# Patient Record
Sex: Male | Born: 1965 | Race: White | Hispanic: No | Marital: Single | Smoking: Current every day smoker
Health system: Southern US, Community
[De-identification: ages and names within clinical notes are randomized; demographics above are authoritative.]

## PROBLEM LIST (undated history)

## (undated) DIAGNOSIS — E041 Nontoxic single thyroid nodule: Secondary | ICD-10-CM

## (undated) DIAGNOSIS — E079 Disorder of thyroid, unspecified: Secondary | ICD-10-CM

## (undated) DIAGNOSIS — I7789 Other specified disorders of arteries and arterioles: Secondary | ICD-10-CM

## (undated) DIAGNOSIS — E039 Hypothyroidism, unspecified: Secondary | ICD-10-CM

## (undated) DIAGNOSIS — E890 Postprocedural hypothyroidism: Secondary | ICD-10-CM

## (undated) DIAGNOSIS — C73 Malignant neoplasm of thyroid gland: Secondary | ICD-10-CM

## (undated) HISTORY — DX: Disorder of thyroid, unspecified: E07.9

## (undated) HISTORY — PX: HERNIA REPAIR: SHX51

---
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
## 2008-07-01 ENCOUNTER — Emergency Department (HOSPITAL_COMMUNITY): Admission: EM | Admit: 2008-07-01 | Discharge: 2008-07-01 | Payer: Self-pay | Admitting: Family Medicine

## 2011-04-18 ENCOUNTER — Encounter: Payer: Self-pay | Admitting: Vascular Surgery

## 2011-04-19 ENCOUNTER — Encounter: Payer: Self-pay | Admitting: Vascular Surgery

## 2011-04-19 ENCOUNTER — Other Ambulatory Visit: Payer: Self-pay

## 2015-03-26 ENCOUNTER — Ambulatory Visit (INDEPENDENT_AMBULATORY_CARE_PROVIDER_SITE_OTHER): Payer: Self-pay | Admitting: Endocrinology

## 2015-03-26 ENCOUNTER — Encounter: Payer: Self-pay | Admitting: Endocrinology

## 2015-03-26 VITALS — BP 126/84 | HR 86 | Temp 98.2°F | Resp 14 | Ht 71.0 in | Wt 151.8 lb

## 2015-03-26 DIAGNOSIS — E041 Nontoxic single thyroid nodule: Secondary | ICD-10-CM

## 2015-03-26 HISTORY — DX: Nontoxic single thyroid nodule: E04.1

## 2015-03-26 LAB — TSH: TSH: 0.27 u[IU]/mL — ABNORMAL LOW (ref 0.35–4.50)

## 2015-03-26 NOTE — Progress Notes (Signed)
Patient ID: Marc Becker, male   DOB: [birth year], 49 y.o.   MRN: [id number]           Reason for Appointment: Thyroid nodule, new consultation    History of Present Illness:   The patient's thyroid enlargement was first discovered in 5/16 when he was having a routine physical exam He subsequently had CT scan and ultrasound exam done Ultrasound examination 7/16 showed the following: Heterogenous right thyroid lobe nodule measuring 6 cm with calcifications.  Also he had other nodules bilaterally with the largest one on the left 1.4 cm in size and solid Needle aspiration biopsy done on 12/10/14 showed cells suspicious for burglary cancer, Bethesda category 5  He has noticed hoarseness of voice since about May this year He has not  had difficulty with swallowing or any choking sensation  Scheduled for surgery in October but because of financial difficulties he did not do it and is here for another opinion  Not clear if he has had any thyroid levels done, no records available  No results found for: FREET4, TSH      Medication List    Notice  As of 03/26/2015 12:58 PM   You have not been prescribed any medications.      Allergies: No Known Allergies  Past Medical History  Diagnosis Date  . Thyroid disease     History reviewed. No pertinent past surgical history.  Family History  Problem Relation Age of Onset  . Thyroid disease Mother   . Thyroid disease Sister   . Thyroid disease Maternal Grandmother    No history of cancer in the family  Social History:  reports that he has been smoking.  He has never used smokeless tobacco. He reports that he does not drink alcohol or use illicit drugs.   Review of Systems:  Review of Systems  Constitutional: Negative for malaise/fatigue.  Respiratory: Negative for cough.   Musculoskeletal: Negative for joint pain.            Examination:   BP 126/84 mmHg  Pulse 86  Temp(Src) 98.2 F (36.8 C)  Resp 14  Ht 5\' 11"   (1.803 m)  Wt 151 lb 12.8 oz (68.856 kg)  BMI 21.18 kg/m2  SpO2 96%   General Appearance: pleasant, averagely built and nourished         Eyes: No abnormal prominence or eyelid swelling.          Neck: The thyroid is enlarged bilaterally, more on the right side.  He has about a 4.5 cm smooth slightly firm nodule on the right and left side is nodular about twice normal in size. There is no stridor. Pemberton sign is negative There is no lymphadenopathy .    Cardiovascular: Normal  heart sounds, no murmur Respiratory:  Lungs clear Abdomen shows no hepatosplenomegaly Neurological: REFLEXES: at biceps are normal.  Skin: no rash        Assessment/Plan:  Multinodular goiter with dominant right-sided nodule, 6 cm on initial evaluation and showing cytology suspicious for papillary carcinoma. He also has hoarseness of his voice making it more likely that he has malignant lesion  Have recommended that he have surgery, most likely total thyroidectomy and will refer him for this. He will see me back in follow-up after surgery for further evaluation and follow-up  TSH to be checked today is baseline   [facility] 03/26/2015

## 2015-04-12 ENCOUNTER — Encounter: Payer: Self-pay | Admitting: Endocrinology

## 2015-04-22 ENCOUNTER — Telehealth: Payer: Self-pay | Admitting: Endocrinology

## 2015-04-22 NOTE — Telephone Encounter (Signed)
[facility] called to notify Dr. Dwyane Dee that Mr. Marc Becker missed his scheduled appointment on 12.6.16    Please advise    Thank you

## 2015-04-22 NOTE — Telephone Encounter (Signed)
Please see below.

## 2015-04-22 NOTE — Telephone Encounter (Signed)
Will discuss on his next visit

## 2015-05-14 ENCOUNTER — Encounter: Payer: Self-pay | Admitting: Endocrinology

## 2015-05-14 NOTE — Progress Notes (Deleted)
Patient ID: Marc Becker, male   DOB: 03-30-1966, 49 y.o.   MRN: [id number]           Reason for Appointment: Thyroid nodule, new consultation    History of Present Illness:   The patient's thyroid enlargement was first discovered in 5/16 when he was having a routine physical exam He subsequently had CT scan and ultrasound exam done Ultrasound examination 7/16 showed the following: Heterogenous right thyroid lobe nodule measuring 6 cm with calcifications.  Also he had other nodules bilaterally with the largest one on the left 1.4 cm in size and solid Needle aspiration biopsy done on 12/10/14 showed cells suspicious for burglary cancer, Bethesda category 5  He has noticed hoarseness of voice since about May this year He has not  had difficulty with swallowing or any choking sensation  Scheduled for surgery in October but because of financial difficulties he did not do it and is here for another opinion  Not clear if he has had any thyroid levels done, no records available  Lab Results  Component Value Date   TSH 0.27* 03/26/2015        Medication List    Notice  As of 05/14/2015  9:46 AM   You have not been prescribed any medications.      Allergies: No Known Allergies  Past Medical History  Diagnosis Date  . Thyroid disease     No past surgical history on file.  Family History  Problem Relation Age of Onset  . Thyroid disease Mother   . Thyroid disease Sister   . Thyroid disease Maternal Grandmother    No history of cancer in the family  Social History:  reports that he has been smoking.  He has never used smokeless tobacco. He reports that he does not drink alcohol or use illicit drugs.   Review of Systems:  Review of Systems  Constitutional: Negative for malaise/fatigue.  Respiratory: Negative for cough.   Musculoskeletal: Negative for joint pain.            Examination:   BP 116/82 mmHg  Pulse 73  Temp(Src) 97.9 F (36.6 C)  Resp 14  Ht 5'  11" (1.803 m)  Wt 152 lb 6.4 oz (69.128 kg)  BMI 21.26 kg/m2  SpO2 98%   General Appearance: pleasant, averagely built and nourished         Eyes: No abnormal prominence or eyelid swelling.          Neck: The thyroid is enlarged bilaterally, more on the right side.  He has about a 4.5 cm smooth slightly firm nodule on the right and left side is nodular about twice normal in size. There is no stridor. Pemberton sign is negative There is no lymphadenopathy .    Cardiovascular: Normal  heart sounds, no murmur Respiratory:  Lungs clear Abdomen shows no hepatosplenomegaly Neurological: REFLEXES: at biceps are normal.  Skin: no rash        Assessment/Plan:  Multinodular goiter with dominant right-sided nodule, 6 cm on initial evaluation and showing cytology suspicious for papillary carcinoma. He also has hoarseness of his voice making it more likely that he has malignant lesion  Have recommended that he have surgery, most likely total thyroidectomy and will refer him for this. He will see me back in follow-up after surgery for further evaluation and follow-up  TSH to be checked today is baseline   [facility] 05/14/2015

## 2015-05-17 NOTE — Progress Notes (Signed)
This encounter was created in error - please disregard.

## 2015-06-02 ENCOUNTER — Ambulatory Visit: Payer: Self-pay | Admitting: Surgery

## 2015-06-15 ENCOUNTER — Inpatient Hospital Stay (HOSPITAL_COMMUNITY)
Admission: RE | Admit: 2015-06-15 | Discharge: 2015-06-15 | Disposition: A | Discharge status: Home / Self Care | Payer: Self-pay | Source: Ambulatory Visit

## 2015-06-16 ENCOUNTER — Encounter (HOSPITAL_COMMUNITY): Payer: Self-pay

## 2015-06-16 ENCOUNTER — Encounter (HOSPITAL_COMMUNITY): Payer: Self-pay | Admitting: Surgery

## 2015-06-16 ENCOUNTER — Encounter (HOSPITAL_COMMUNITY)
Admission: RE | Admit: 2015-06-16 | Discharge: 2015-06-16 | Disposition: A | Discharge status: Home / Self Care | Payer: Self-pay | Source: Ambulatory Visit | Attending: Surgery | Admitting: Surgery

## 2015-06-16 DIAGNOSIS — F17200 Nicotine dependence, unspecified, uncomplicated: Secondary | ICD-10-CM | POA: Insufficient documentation

## 2015-06-16 DIAGNOSIS — Z01812 Encounter for preprocedural laboratory examination: Secondary | ICD-10-CM | POA: Insufficient documentation

## 2015-06-16 DIAGNOSIS — C73 Malignant neoplasm of thyroid gland: Secondary | ICD-10-CM

## 2015-06-16 DIAGNOSIS — Z01818 Encounter for other preprocedural examination: Secondary | ICD-10-CM | POA: Insufficient documentation

## 2015-06-16 HISTORY — DX: Other specified disorders of arteries and arterioles: I77.89

## 2015-06-16 HISTORY — DX: Malignant neoplasm of thyroid gland: C73

## 2015-06-16 LAB — CBC
HCT: 43.8 % (ref 39.0–52.0)
HEMOGLOBIN: 14.8 g/dL (ref 13.0–17.0)
MCH: 29.5 pg (ref 26.0–34.0)
MCHC: 33.8 g/dL (ref 30.0–36.0)
MCV: 87.3 fL (ref 78.0–100.0)
Platelets: 290 10*3/uL (ref 150–400)
RBC: 5.02 MIL/uL (ref 4.22–5.81)
RDW: 13.4 % (ref 11.5–15.5)
WBC: 8 10*3/uL (ref 4.0–10.5)

## 2015-06-16 LAB — BASIC METABOLIC PANEL
Anion gap: 8 (ref 5–15)
BUN: 11 mg/dL (ref 6–20)
CHLORIDE: 105 mmol/L (ref 101–111)
CO2: 28 mmol/L (ref 22–32)
Calcium: 9.4 mg/dL (ref 8.9–10.3)
Creatinine, Ser: 0.94 mg/dL (ref 0.61–1.24)
GFR calc Af Amer: >60 mL/min (ref >60)
GFR calc non Af Amer: >60 mL/min (ref >60)
Glucose, Bld: 106 mg/dL — ABNORMAL HIGH (ref 65–99)
POTASSIUM: 4.4 mmol/L (ref 3.5–5.1)
SODIUM: 141 mmol/L (ref 135–145)

## 2015-06-16 MED ORDER — CEFAZOLIN SODIUM-DEXTROSE 2-3 GM-% IV SOLR
2.0000 g | INTRAVENOUS | Status: AC
  Administered 2015-06-17: 2 g via INTRAVENOUS
  Filled 2015-06-16: qty 50

## 2015-06-16 NOTE — Progress Notes (Signed)
Spoke with Marc Becker re; CXR showing nodule left lung + dev trachea.  Also called CxR to Dr. Gala Lewandowsky office and spoke to Oriental who stated she would give message to Dr. Gala Lewandowsky assistant.

## 2015-06-16 NOTE — H&P (Signed)
General Surgery [facility]  Marc Becker DOB: 03/18/1966 Single / Language: [name] / Race: White Male  History of Present Illness  The patient is a 50 year old male who presents with thyroid cancer.  Patient is referred by Dr. Elayne Snare for evaluation of newly diagnosed papillary thyroid carcinoma. Patient originally was diagnosed with a thyroid nodule in May 2016 by his physician in [facility]. Patient apparently underwent CT scan of the neck and chest as well as an ultrasound of the neck. This demonstrated a 6 cm heterogeneous right thyroid mass with calcifications. There were other nodules bilaterally with the largest nodule on the left measuring 1.4 cm. Patient underwent fine-needle aspiration biopsy on December 10, 2014 at [facility]. Cytopathology was read as suspicious for papillary thyroid carcinoma, Bethesda category V. Patient has also noted hoarseness since the spring of 2016. This has been persistent. Patient is a smoker. He has not had the hoarseness evaluated. He denies any other compressive symptoms such as dysphagia or dyspnea. Patient has had no prior head or neck surgery. He has never been on thyroid medication. There is a family history of thyroid disease in his mother, his sister, and his maternal grandmother. None of these individuals were diagnosed with thyroid cancer. There is no family history of other endocrine neoplasms.  Other Problems Hemorrhoids  Past Surgical History Laparoscopic Inguinal Hernia Surgery Left.  Allergies No Known Drug Allergies01/18/2017  Medication History No Current Medications Medications Reconciled  Social History Alcohol use Remotely quit alcohol use. Caffeine use Tea. Illicit drug use Remotely quit drug use. Tobacco use Current every day smoker.  Family History Thyroid problems Mother.  Review of Systems Cardiovascular Not Present- Chest Pain, Difficulty  Breathing Lying Down, Leg Cramps, Palpitations, Rapid Heart Rate, Shortness of Breath and Swelling of Extremities. Gastrointestinal Not Present- Abdominal Pain, Bloating, Bloody Stool, Change in Bowel Habits, Chronic diarrhea, Constipation, Difficulty Swallowing, Excessive gas, Gets full quickly at meals, Hemorrhoids, Indigestion, Nausea, Rectal Pain and Vomiting. Male Genitourinary Not Present- Blood in Urine, Change in Urinary Stream, Frequency, Impotence, Nocturia, Painful Urination, Urgency and Urine Leakage.  Vitals Weight: 147.4 lb Height: 71in Body Surface Area: 1.85 m Body Mass Index: 20.56 kg/m  Temp.: 97.36F(Oral)  Pulse: 72 (Regular)  BP: 118/76 (Sitting, Left Arm, Standard)  Physical Exam  General - appears comfortable, no distress; not diaphorectic  HEENT - normocephalic; sclerae clear, gaze conjugate; mucous membranes moist, dentition good; voice normal  Neck - asymmetric on extension; no palpable anterior or posterior cervical adenopathy; left thyroid lobe without palpable abnormality; right thyroid lobe with a large firm dominant mass occupying the inferior pole of the right lobe of the gland and extending beneath the right clavicle; it is mobile with swallowing and nontender  Chest - clear bilaterally without rhonchi, rales, or wheeze  Cor - regular rhythm with normal rate; no significant murmur  Ext - non-tender without significant edema or lymphedema  Neuro - grossly intact; mild bilateral tremor   Assessment & Plan   PAPILLARY THYROID CARCINOMA (C73) MULTIPLE THYROID NODULES (E04.2) HOARSENESS, PERSISTENT (R49.0)  Patient presents with a large right thyroid mass and biopsy evidence of papillary thyroid carcinoma. Patient is provided with written literature on thyroid surgery to review at home.  Patient and I reviewed his imaging studies, his cytopathology report, and his clinical history. I have recommended proceeding with total thyroidectomy with  limited lymph node dissection. We discussed the risk and benefits of the surgery including the potential  for permanent recurrent laryngeal nerve injury and injury to parathyroid glands. We discussed the hospital stay to be anticipated and the postoperative recovery and time out of work. We discussed the potential need for radioactive iodine treatment. We discussed the need for lifelong thyroid hormone replacement. Patient understands and wishes to proceed with surgery in the near future.  Patient does not have insurance currently. We will ask our financial staff to assist him with possible benefits available to him.  The risks and benefits of the procedure have been discussed at length with the patient. The patient understands the proposed procedure, potential alternative treatments, and the course of recovery to be expected. All of the patient's questions have been answered at this time. The patient wishes to proceed with surgery.  Earnstine Regal, MD, [facility] Office: [phone]

## 2015-06-16 NOTE — Progress Notes (Signed)
Anesthesia Chart Review: Patient is a 50 year old male scheduled for total thyroidectomy with limited LN dissection on 06/17/15 by Dr. Harlow Asa.  History includes papillary thyroid carcinoma, hoarseness, smoking, hernia repair (not specified). 3.8 cm Ascending aortic enlargement by 09/2014 CT. BMI is 21. Thyroid surgery initially planned in 02/2015 (by Dr. Cletis Athens with [facility]), but postponed due to financial issues and for a second opinion with endocrinologist Dr. Elayne Snare and later with Dr. Harlow Asa. TSH at his 03/26/15 endocrinology visit was 0.27 (0.35-4.50).  No meds listed. No PCP listed, but [facility] records indicate that he was referred to [facility] for preoperative evaluation last year.  Vitals: BP 113/71, HR 64, RR 20, T 36.9C, O2 sat 100%.  06/16/15 CXR: IMPRESSION: 1. Hyperinflation consistent with COPD in the patient's history of tobacco use. Subcentimeter nodule peripherally in the left lung. This nodule was not clearly evident on the previous CT scan. There are no previous chest x-rays for review. This will merit postoperative follow-up CT scanning. 2. Mild deviation of the trachea toward the left likely due to the enlarged thyroid gland. (PAT RN called result to Dr. Gala Lewandowsky office. He will need out-patient follow-up.)  10/05/14 CT Neck/Chest/Abd/Pelvis [facility], see PACS): Impression: 1. Patient's palpable right neck mass corresponds to a 6 cm cystic and solid right thyroid lobe lesion. There are also other thyroid lesions. Recommend ultrasound correlation and biopsy/aspiration of the dominant lesion. 2. No other neck mass or adenopathy. 3. Low-attenuation liver lesions are likely benign cyst and hemangiomas. A follow-up MRI abdomen without and with contrast would be helpful for further evaluation. 4. Mild fusiform enlargement of the ascending aorta with maximum measurement of 3.8 cm. Recommend annual imaging followed by CTA or MRA.  5. No adenopathy in the  chest, abdomen or pelvis.  Preoperative CBC and BMET noted. Glucose 106.   Reviewed above with anesthesiologist Dr. Therisa Doyne. If no acute changes then it is anticipated that he can proceed as planned.  [facility] Short Stay Center/Anesthesiology Phone [phone] 06/16/2015 4:36 PM

## 2015-06-17 ENCOUNTER — Observation Stay (HOSPITAL_COMMUNITY)
Admission: RE | Admit: 2015-06-17 | Discharge: 2015-06-18 | Disposition: A | Discharge status: Home / Self Care | Payer: Self-pay | Source: Ambulatory Visit | Attending: Surgery | Admitting: Surgery

## 2015-06-17 ENCOUNTER — Encounter (HOSPITAL_COMMUNITY)
Admission: RE | Disposition: A | Discharge status: Home / Self Care | Payer: Self-pay | Source: Ambulatory Visit | Attending: Surgery

## 2015-06-17 ENCOUNTER — Encounter (HOSPITAL_COMMUNITY): Payer: Self-pay | Admitting: Certified Registered Nurse Anesthetist

## 2015-06-17 ENCOUNTER — Ambulatory Visit (HOSPITAL_COMMUNITY): Payer: Self-pay | Admitting: Vascular Surgery

## 2015-06-17 ENCOUNTER — Ambulatory Visit (HOSPITAL_COMMUNITY): Payer: Self-pay | Admitting: Certified Registered Nurse Anesthetist

## 2015-06-17 DIAGNOSIS — F17200 Nicotine dependence, unspecified, uncomplicated: Secondary | ICD-10-CM | POA: Insufficient documentation

## 2015-06-17 DIAGNOSIS — C73 Malignant neoplasm of thyroid gland: Principal | ICD-10-CM | POA: Insufficient documentation

## 2015-06-17 HISTORY — PX: THYROIDECTOMY: SHX17

## 2015-06-17 SURGERY — THYROIDECTOMY
Anesthesia: General | Site: Neck

## 2015-06-17 MED ORDER — NEOSTIGMINE METHYLSULFATE 10 MG/10ML IV SOLN
INTRAVENOUS | Status: DC | PRN
  Administered 2015-06-17: 3 mg via INTRAVENOUS

## 2015-06-17 MED ORDER — HEMOSTATIC AGENTS (NO CHARGE) OPTIME
TOPICAL | Status: DC | PRN
  Administered 2015-06-17: 1 via TOPICAL

## 2015-06-17 MED ORDER — HYDROCODONE-ACETAMINOPHEN 5-325 MG PO TABS: 1.0000 | ORAL_TABLET | ORAL | Status: DC | PRN

## 2015-06-17 MED ORDER — ACETAMINOPHEN 650 MG RE SUPP: 650.0000 mg | Freq: Four times a day (QID) | RECTAL | Status: DC | PRN

## 2015-06-17 MED ORDER — MIDAZOLAM HCL 2 MG/2ML IJ SOLN
INTRAMUSCULAR | Status: AC
  Filled 2015-06-17: qty 2

## 2015-06-17 MED ORDER — MIDAZOLAM HCL 5 MG/5ML IJ SOLN
INTRAMUSCULAR | Status: DC | PRN
  Administered 2015-06-17: 2 mg via INTRAVENOUS

## 2015-06-17 MED ORDER — LIDOCAINE HCL (CARDIAC) 20 MG/ML IV SOLN
INTRAVENOUS | Status: AC
  Filled 2015-06-17: qty 5

## 2015-06-17 MED ORDER — FENTANYL CITRATE (PF) 250 MCG/5ML IJ SOLN
INTRAMUSCULAR | Status: AC
  Filled 2015-06-17: qty 5

## 2015-06-17 MED ORDER — ROCURONIUM BROMIDE 50 MG/5ML IV SOLN
INTRAVENOUS | Status: AC
  Filled 2015-06-17: qty 1

## 2015-06-17 MED ORDER — FENTANYL CITRATE (PF) 100 MCG/2ML IJ SOLN
INTRAMUSCULAR | Status: DC | PRN
  Administered 2015-06-17 (×4): 50 ug via INTRAVENOUS
  Administered 2015-06-17: 150 ug via INTRAVENOUS
  Administered 2015-06-17 (×4): 50 ug via INTRAVENOUS

## 2015-06-17 MED ORDER — GLYCOPYRROLATE 0.2 MG/ML IJ SOLN
INTRAMUSCULAR | Status: DC | PRN
  Administered 2015-06-17: .4 mg via INTRAVENOUS

## 2015-06-17 MED ORDER — HYDROMORPHONE HCL 1 MG/ML IJ SOLN
1.0000 mg | INTRAMUSCULAR | Status: DC | PRN
  Administered 2015-06-17: 1 mg via INTRAVENOUS
  Filled 2015-06-17: qty 1

## 2015-06-17 MED ORDER — FENTANYL CITRATE (PF) 100 MCG/2ML IJ SOLN
25.0000 ug | INTRAMUSCULAR | Status: DC | PRN
  Administered 2015-06-17: 25 ug via INTRAVENOUS

## 2015-06-17 MED ORDER — GLYCOPYRROLATE 0.2 MG/ML IJ SOLN
INTRAMUSCULAR | Status: AC
  Filled 2015-06-17: qty 2

## 2015-06-17 MED ORDER — LIDOCAINE HCL (CARDIAC) 20 MG/ML IV SOLN
INTRAVENOUS | Status: DC | PRN
  Administered 2015-06-17: 90 mg via INTRAVENOUS

## 2015-06-17 MED ORDER — ONDANSETRON HCL 4 MG/2ML IJ SOLN
INTRAMUSCULAR | Status: DC | PRN
  Administered 2015-06-17: 4 mg via INTRAVENOUS

## 2015-06-17 MED ORDER — ONDANSETRON HCL 4 MG/2ML IJ SOLN: 4.0000 mg | Freq: Four times a day (QID) | INTRAMUSCULAR | Status: DC | PRN

## 2015-06-17 MED ORDER — ROCURONIUM BROMIDE 100 MG/10ML IV SOLN
INTRAVENOUS | Status: DC | PRN
  Administered 2015-06-17: 40 mg via INTRAVENOUS

## 2015-06-17 MED ORDER — LABETALOL HCL 5 MG/ML IV SOLN
INTRAVENOUS | Status: AC
  Filled 2015-06-17: qty 4

## 2015-06-17 MED ORDER — LACTATED RINGERS IV SOLN
INTRAVENOUS | Status: DC
  Administered 2015-06-17 (×2): via INTRAVENOUS

## 2015-06-17 MED ORDER — KCL IN DEXTROSE-NACL 20-5-0.45 MEQ/L-%-% IV SOLN
INTRAVENOUS | Status: DC
  Administered 2015-06-17: 50 mL/h via INTRAVENOUS

## 2015-06-17 MED ORDER — PHENYLEPHRINE HCL 10 MG/ML IJ SOLN
INTRAMUSCULAR | Status: DC | PRN
  Administered 2015-06-17 (×2): 120 ug via INTRAVENOUS

## 2015-06-17 MED ORDER — ACETAMINOPHEN 325 MG PO TABS: 650.0000 mg | ORAL_TABLET | Freq: Four times a day (QID) | ORAL | Status: DC | PRN

## 2015-06-17 MED ORDER — ONDANSETRON 4 MG PO TBDP
4.0000 mg | ORAL_TABLET | Freq: Four times a day (QID) | ORAL | Status: DC | PRN
  Administered 2015-06-18: 4 mg via ORAL
  Filled 2015-06-17: qty 1

## 2015-06-17 MED ORDER — 0.9 % SODIUM CHLORIDE (POUR BTL) OPTIME
TOPICAL | Status: DC | PRN
  Administered 2015-06-17: 1000 mL

## 2015-06-17 MED ORDER — FENTANYL CITRATE (PF) 100 MCG/2ML IJ SOLN
INTRAMUSCULAR | Status: AC
  Administered 2015-06-17: 25 ug via INTRAVENOUS
  Filled 2015-06-17: qty 2

## 2015-06-17 MED ORDER — PROPOFOL 10 MG/ML IV BOLUS
INTRAVENOUS | Status: DC | PRN
  Administered 2015-06-17: 180 mg via INTRAVENOUS

## 2015-06-17 MED ORDER — KCL IN DEXTROSE-NACL 20-5-0.45 MEQ/L-%-% IV SOLN
INTRAVENOUS | Status: AC
  Filled 2015-06-17: qty 1000

## 2015-06-17 MED ORDER — ONDANSETRON HCL 4 MG/2ML IJ SOLN
INTRAMUSCULAR | Status: AC
  Filled 2015-06-17: qty 2

## 2015-06-17 MED ORDER — ONDANSETRON HCL 4 MG/2ML IJ SOLN
INTRAMUSCULAR | Status: AC
  Administered 2015-06-17: 4 mg via INTRAVENOUS
  Filled 2015-06-17: qty 2

## 2015-06-17 MED ORDER — LABETALOL HCL 5 MG/ML IV SOLN
INTRAVENOUS | Status: DC | PRN
  Administered 2015-06-17 (×2): 5 mg via INTRAVENOUS

## 2015-06-17 MED ORDER — PROPOFOL 10 MG/ML IV BOLUS
INTRAVENOUS | Status: AC
  Filled 2015-06-17: qty 20

## 2015-06-17 MED ORDER — CALCIUM CARBONATE 1250 (500 CA) MG PO TABS
2.0000 | ORAL_TABLET | Freq: Three times a day (TID) | ORAL | Status: DC
  Administered 2015-06-18: 1000 mg via ORAL
  Filled 2015-06-17: qty 1

## 2015-06-17 MED ORDER — ONDANSETRON HCL 4 MG/2ML IJ SOLN
4.0000 mg | Freq: Once | INTRAMUSCULAR | Status: AC | PRN
Start: 2015-06-17 — End: 2015-06-17
  Administered 2015-06-17: 4 mg via INTRAVENOUS

## 2015-06-17 SURGICAL SUPPLY — 47 items
APL SKNCLS STERI-STRIP NONHPOA (GAUZE/BANDAGES/DRESSINGS) ×1
ATTRACTOMAT 16X20 MAGNETIC DRP (DRAPES) ×3 IMPLANT
BENZOIN TINCTURE PRP APPL 2/3 (GAUZE/BANDAGES/DRESSINGS) ×3 IMPLANT
BLADE SURG 10 STRL SS (BLADE) ×3 IMPLANT
BLADE SURG 15 STRL LF DISP TIS (BLADE) ×1 IMPLANT
BLADE SURG 15 STRL SS (BLADE) ×3
BLADE SURG ROTATE 9660 (MISCELLANEOUS) IMPLANT
CANISTER SUCTION 2500CC (MISCELLANEOUS) ×3 IMPLANT
CHLORAPREP W/TINT 10.5 ML (MISCELLANEOUS) ×3 IMPLANT
CLIP TI MEDIUM 24 (CLIP) ×5 IMPLANT
CLIP TI WIDE RED SMALL 24 (CLIP) ×5 IMPLANT
CLOSURE WOUND 1/2 X4 (GAUZE/BANDAGES/DRESSINGS) ×1
CONT SPEC 4OZ CLIKSEAL STRL BL (MISCELLANEOUS) IMPLANT
COVER SURGICAL LIGHT HANDLE (MISCELLANEOUS) ×3 IMPLANT
CRADLE DONUT ADULT HEAD (MISCELLANEOUS) ×3 IMPLANT
DRAPE LAPAROTOMY T 98X78 PEDS (DRAPES) ×3 IMPLANT
DRAPE UTILITY XL STRL (DRAPES) ×3 IMPLANT
ELECT CAUTERY BLADE 6.4 (BLADE) ×3 IMPLANT
ELECT REM PT RETURN 9FT ADLT (ELECTROSURGICAL) ×3
ELECTRODE REM PT RTRN 9FT ADLT (ELECTROSURGICAL) ×1 IMPLANT
GAUZE SPONGE 4X4 12PLY STRL (GAUZE/BANDAGES/DRESSINGS) ×3 IMPLANT
GAUZE SPONGE 4X4 16PLY XRAY LF (GAUZE/BANDAGES/DRESSINGS) ×3 IMPLANT
GLOVE SURG ORTHO 8.0 STRL STRW (GLOVE) ×3 IMPLANT
GOWN STRL REUS W/ TWL LRG LVL3 (GOWN DISPOSABLE) ×2 IMPLANT
GOWN STRL REUS W/ TWL XL LVL3 (GOWN DISPOSABLE) ×1 IMPLANT
GOWN STRL REUS W/TWL LRG LVL3 (GOWN DISPOSABLE) ×6
GOWN STRL REUS W/TWL XL LVL3 (GOWN DISPOSABLE) ×3
HEMOSTAT SURGICEL 2X4 FIBR (HEMOSTASIS) ×3 IMPLANT
KIT BASIN OR (CUSTOM PROCEDURE TRAY) ×3 IMPLANT
KIT ROOM TURNOVER OR (KITS) ×3 IMPLANT
NS IRRIG 1000ML POUR BTL (IV SOLUTION) ×3 IMPLANT
PACK SURGICAL SETUP 50X90 (CUSTOM PROCEDURE TRAY) ×3 IMPLANT
PAD ARMBOARD 7.5X6 YLW CONV (MISCELLANEOUS) ×3 IMPLANT
PENCIL BUTTON HOLSTER BLD 10FT (ELECTRODE) ×3 IMPLANT
SHEARS HARMONIC 9CM CVD (BLADE) ×3 IMPLANT
SPECIMEN JAR MEDIUM (MISCELLANEOUS) IMPLANT
SPONGE INTESTINAL PEANUT (DISPOSABLE) ×3 IMPLANT
STRIP CLOSURE SKIN 1/2X4 (GAUZE/BANDAGES/DRESSINGS) ×2 IMPLANT
SUT MNCRL AB 4-0 PS2 18 (SUTURE) ×5 IMPLANT
SUT SILK 2 0 (SUTURE) ×3
SUT SILK 2-0 18XBRD TIE 12 (SUTURE) ×1 IMPLANT
SUT VIC AB 3-0 SH 18 (SUTURE) ×3 IMPLANT
SYR BULB 3OZ (MISCELLANEOUS) ×3 IMPLANT
TOWEL OR 17X24 6PK STRL BLUE (TOWEL DISPOSABLE) ×3 IMPLANT
TOWEL OR 17X26 10 PK STRL BLUE (TOWEL DISPOSABLE) ×3 IMPLANT
TUBE CONNECTING 12'X1/4 (SUCTIONS) ×1
TUBE CONNECTING 12X1/4 (SUCTIONS) ×2 IMPLANT

## 2015-06-17 NOTE — Op Note (Signed)
Procedure Note  Pre-operative Diagnosis:  Probable papillary thyroid carcinoma  Post-operative Diagnosis:  same  Surgeon:  Earnstine Regal, MD, FACS  Assistant:  Sharyn Dross, RNFA   Procedure:  Total thyroidectomy with limited central compartment lymph node dissection  Anesthesia:  General  Estimated Blood Loss:  minimal  Drains: none         Specimen: thyroid to pathology  Indications:  Patient is referred by Dr. Elayne Snare for evaluation of newly diagnosed papillary thyroid carcinoma. Patient originally was diagnosed with a thyroid nodule in May 2016 by his physician in [facility]. Patient apparently underwent CT scan of the neck and chest as well as an ultrasound of the neck. This demonstrated a 6 cm heterogeneous right thyroid mass with calcifications. There were other nodules bilaterally with the largest nodule on the left measuring 1.4 cm. Patient underwent fine-needle aspiration biopsy on December 10, 2014 at [facility]. Cytopathology was read as suspicious for papillary thyroid carcinoma, Bethesda category V. Patient has also noted hoarseness since the spring of 2016. This has been persistent. Patient is a smoker. He has not had the hoarseness evaluated. He denies any other compressive symptoms such as dysphagia or dyspnea. Patient has had no prior head or neck surgery.   Procedure Details: Procedure was done in OR #8 at the [facility].  The patient was brought to the operating room and placed in a supine position on the operating room table.  Following administration of general anesthesia, the patient was positioned and then prepped and draped in the usual aseptic fashion.  After ascertaining that an adequate level of anesthesia had been achieved, a Kocher incision was made with #15 blade.  Dissection was carried through subcutaneous tissues and platysma. Hemostasis was achieved with the electrocautery.  Skin flaps were elevated cephalad and  caudad from the thyroid notch to the sternal notch.  The Mahorner self-retaining retractor was placed for exposure.  Strap muscles were incised in the midline and dissection was begun on the left side.  Strap muscles were reflected laterally.  Left thyroid lobe was mildly enlarged and contained multiple firm nodules measuring 1-2 cm in size.  The left lobe was gently mobilized with blunt dissection.  Superior pole vessels were dissected out and divided individually between small and medium Ligaclips with the Harmonic scalpel.  The thyroid lobe was rolled anteriorly.  Branches of the inferior thyroid artery were divided between small Ligaclips with the Harmonic scalpel.  Inferior venous tributaries were divided between Ligaclips.  Both the superior and inferior parathyroid glands were identified and preserved on their vascular pedicles.  The recurrent laryngeal nerve was identified and preserved along its course.  The ligament of Gwenlyn Found was released with the electrocautery and the gland was mobilized onto the anterior trachea. Isthmus was mobilized across the midline.  There was no pyramidal lobe present.  Dry pack was placed in the left neck.  Next, the right thyroid lobe was gently mobilized with blunt dissection.  Right thyroid lobe was markedly enlarged with a dominant 4-5 cm mass located posteriorly.  Superior pole vessels were dissected out and divided between small and medium Ligaclips with the Harmonic scalpel.  Superior parathyroid was identified and preserved.  Inferior venous tributaries were divided between medium Ligaclips with the Harmonic scalpel.  The right thyroid lobe was rolled anteriorly and the branches of the inferior thyroid artery divided between small Ligaclips.  The right recurrent laryngeal nerve was identified and preserved along its course.  The  ligament of Gwenlyn Found was released with the electrocautery.  The right thyroid lobe was mobilized onto the anterior trachea and the remainder of the  thyroid was dissected off the anterior trachea and the thyroid was completely excised.  A suture was used to mark the right lobe. The entire thyroid gland was submitted to pathology for review.  The central compartment was dissected.  There were no obviously enlarged lymph nodes.  Care was taken to avoid parathyroid tissue.  Tissue was dissected out and excised from carotid artery to carotid artery anterior to the trachea and down to the level of the innominate.  The neck was irrigated with warm saline.  Fibrillar was placed throughout the operative field.  Strap muscles were reapproximated in the midline with interrupted 3-0 Vicryl sutures.  Platysma was closed with interrupted 3-0 Vicryl sutures.  Skin was closed with a running 4-0 Monocryl subcuticular suture.  Wound was washed and dried and benzoin and steri-strips were applied.  Dry gauze dressing was placed.  The patient was awakened from anesthesia and brought to the recovery room.  The patient tolerated the procedure well.   Earnstine Regal, MD, [facility] Office: [phone]

## 2015-06-17 NOTE — Transfer of Care (Signed)
Immediate Anesthesia Transfer of Care Note  Patient: Marc Becker  Procedure(s) Performed: Procedure(s): TOTAL THYROIDECTOMY WITH LIMITED LYMPH NODE DISECTION (N/A)  Patient Location: PACU  Anesthesia Type:General  Level of Consciousness: awake and patient cooperative, drowsy  Airway & Oxygen Therapy: Patient Spontanous Breathing and Patient connected to nasal cannula oxygen  Post-op Assessment: Report given to RN and Post -op Vital signs reviewed and stable  Post vital signs: Reviewed and stable  Last Vitals:  Filed Vitals:   06/17/15 1100  BP: 112/77  Pulse: 66  Temp: 37.1 C  Resp: 18    Complications: No apparent anesthesia complications

## 2015-06-17 NOTE — Anesthesia Procedure Notes (Signed)
Procedure Name: Intubation Date/Time: 06/17/2015 1:34 PM Performed by: Salli Quarry Krystel Fletchall Pre-anesthesia Checklist: Patient identified, Emergency Drugs available, Suction available and Patient being monitored Patient Re-evaluated:Patient Re-evaluated prior to inductionOxygen Delivery Method: Circle system utilized Preoxygenation: Pre-oxygenation with 100% oxygen Intubation Type: IV induction Ventilation: Mask ventilation without difficulty Laryngoscope Size: Mac and 4 Grade View: Grade I Tube type: Oral Tube size: 8.0 mm Number of attempts: 1 Airway Equipment and Method: Stylet Placement Confirmation: ETT inserted through vocal cords under direct vision,  positive ETCO2 and breath sounds checked- equal and bilateral Secured at: 23 cm Tube secured with: Tape Dental Injury: Teeth and Oropharynx as per pre-operative assessment

## 2015-06-17 NOTE — Interval H&P Note (Signed)
History and Physical Interval Note:  06/17/2015 12:05 PM  Marc Becker  has presented today for surgery, with the diagnosis of papillary thyroid carcinoma.  The various methods of treatment have been discussed with the patient and family. After consideration of risks, benefits and other options for treatment, the patient has consented to    Procedure(s): TOTAL THYROIDECTOMY WITH LIMITED LYMPH NODE DISECTION (N/A) as a surgical intervention .    The patient's history has been reviewed, patient examined, no change in status, stable for surgery.  I have reviewed the patient's chart and labs.  Questions were answered to the patient's satisfaction.    Earnstine Regal, MD, [facility] Office: [place]

## 2015-06-17 NOTE — Anesthesia Preprocedure Evaluation (Addendum)
Anesthesia Evaluation  Patient identified by MRN, date of birth, ID band Patient awake    Reviewed: Allergy & Precautions, NPO status , Patient's Chart, lab work & pertinent test results  History of Anesthesia Complications Negative for: history of anesthetic complications  Airway Mallampati: II  TM Distance: >3 FB Neck ROM: Full    Dental no notable dental hx. (+) Dental Advisory Given   Pulmonary Current Smoker,    Pulmonary exam normal breath sounds clear to auscultation       Cardiovascular + Peripheral Vascular Disease  Normal cardiovascular exam Rhythm:Regular Rate:Normal     Neuro/Psych negative neurological ROS  negative psych ROS   GI/Hepatic negative GI ROS, Neg liver ROS,   Endo/Other  Thyroid nodule  Renal/GU negative Renal ROS  negative genitourinary   Musculoskeletal negative musculoskeletal ROS (+)   Abdominal   Peds negative pediatric ROS (+)  Hematology negative hematology ROS (+)   Anesthesia Other Findings   Reproductive/Obstetrics negative OB ROS                            Anesthesia Physical Anesthesia Plan  ASA: II  Anesthesia Plan: General   Post-op Pain Management:    Induction: Intravenous  Airway Management Planned: Oral ETT  Additional Equipment:   Intra-op Plan:   Post-operative Plan: Extubation in OR  Informed Consent: I have reviewed the patients History and Physical, chart, labs and discussed the procedure including the risks, benefits and alternatives for the proposed anesthesia with the patient or authorized representative who has indicated his/her understanding and acceptance.   Dental advisory given  Plan Discussed with: CRNA  Anesthesia Plan Comments:         Anesthesia Quick Evaluation

## 2015-06-18 ENCOUNTER — Encounter (HOSPITAL_COMMUNITY): Payer: Self-pay | Admitting: Surgery

## 2015-06-18 LAB — BASIC METABOLIC PANEL
ANION GAP: 9 (ref 5–15)
BUN: 8 mg/dL (ref 6–20)
CALCIUM: 9 mg/dL (ref 8.9–10.3)
CO2: 29 mmol/L (ref 22–32)
Chloride: 101 mmol/L (ref 101–111)
Creatinine, Ser: 1.01 mg/dL (ref 0.61–1.24)
GFR calc Af Amer: >60 mL/min (ref >60)
Glucose, Bld: 119 mg/dL — ABNORMAL HIGH (ref 65–99)
POTASSIUM: 4.1 mmol/L (ref 3.5–5.1)
SODIUM: 139 mmol/L (ref 135–145)

## 2015-06-18 MED ORDER — CALCIUM CARBONATE 1250 (500 CA) MG PO TABS: 2.0000 | ORAL_TABLET | Freq: Two times a day (BID) | ORAL | Status: DC

## 2015-06-18 MED ORDER — SYNTHROID 100 MCG PO TABS: 100.0000 ug | ORAL_TABLET | Freq: Every day | ORAL | Status: DC

## 2015-06-18 MED ORDER — HYDROCODONE-ACETAMINOPHEN 5-325 MG PO TABS: 1.0000 | ORAL_TABLET | ORAL | Status: DC | PRN

## 2015-06-18 NOTE — Discharge Summary (Signed)
Physician Discharge Summary [facility]  Patient ID: Marc Becker MRN: [id number] DOB/AGE: 1965-11-11 50 y.o.  Admit date: 06/17/2015 Discharge date: 06/18/2015  Admission Diagnoses:  Suspected papillary thyroid carcinoma  Discharge Diagnoses:  Principal Problem:   Papillary thyroid carcinoma [facility])   Discharged Condition: good  Hospital Course: Patient was admitted for observation following thyroid surgery.  Post op course was uncomplicated.  Pain was well controlled.  Tolerated diet.  Post op calcium level on morning following surgery was 9.4 mg/dl.  Patient was prepared for discharge home on POD#1.  Consults: None  Treatments: surgery: total thyroidectomy with limited lymph node dissection  Discharge Exam: Blood pressure 133/68, pulse 76, temperature 98.6 F (37 C), temperature source Oral, resp. rate 19, height 5\' 11"  (1.803 m), weight 67.858 kg (149 lb 9.6 oz), SpO2 96 %. HEENT - clear Neck - wound dry and intact with minimal STS; voice slight hoarse Chest - clear bilaterally Cor - RRR  Disposition: Home  Discharge Instructions    Apply dressing    Complete by:  As directed   Apply light gauze dressing to wound before discharge home today.     Diet - low sodium heart healthy    Complete by:  As directed      Discharge instructions    Complete by:  As directed   [facility]  THYROID & PARATHYROID SURGERY:  POST-OP INSTRUCTIONS  Always review your discharge instruction sheet from the facility where your surgery was performed.  A prescription for pain medication may be given to you upon discharge.  Take your pain medication as prescribed.  If narcotic pain medicine is not needed, then you may take acetaminophen (Tylenol) or ibuprofen (Advil) as needed.  Take your usually prescribed medications unless otherwise directed.  If you need a refill on your pain medication, please contact your pharmacy. They will contact our office to  request authorization.  Prescriptions will not be processed by our office after 5 pm or on weekends.  Start with a light diet upon arrival home, such as soup and crackers or toast.  Be sure to drink plenty of fluids daily.  Resume your normal diet the day after surgery.  Most patients will experience some swelling and bruising on the chest and neck area.  Ice packs will help.  Swelling and bruising can take several days to resolve.   It is common to experience some constipation after surgery.  Increasing fluid intake and taking a stool softener will usually help or prevent this problem.  A mild laxative (Milk of Magnesia or Miralax) should be taken according to package directions if there has been no bowel movement after 48 hours.  You have steri-strips and a gauze dressing over your incision.  You may remove the gauze bandage on the second day after surgery, and you may shower at that time.  Leave your steri-strips (small skin tapes) in place directly over the incision.  These strips should remain on the skin for 5-7 days and then be removed.  You may get them wet in the shower and pat them dry.  You may resume regular (light) daily activities beginning the next day - such as daily self-care, walking, climbing stairs - gradually increasing activities as tolerated.  You may have sexual intercourse when it is comfortable.  Refrain from any heavy lifting or straining until approved by your doctor.  You may drive when you no longer are taking prescription pain medication, you can comfortably  wear a seatbelt, and you can safely maneuver your car and apply brakes.  You should see your doctor in the office for a follow-up appointment approximately two to three weeks after your surgery.  Make sure that you call for this appointment within a day or two after you arrive home to insure a convenient appointment time.  WHEN TO CALL YOUR DOCTOR: -- Fever greater than 101.5 -- Inability to urinate -- Nausea  and/or vomiting - persistent -- Extreme swelling or bruising -- Continued bleeding from incision -- Increased pain, redness, or drainage from the incision -- Difficulty swallowing or breathing -- Muscle cramping or spasms -- Numbness or tingling in hands or around lips  The clinic staff is available to answer your questions during regular business hours.  Please don't hesitate to call and ask to speak to one of the nurses if you have concerns.  Earnstine Regal, MD, [facility] Office: [phone]  Website: [email or web address]     Increase activity slowly    Complete by:  As directed      Remove dressing in 24 hours    Complete by:  As directed             Medication List    TAKE these medications        calcium carbonate 1250 (500 Ca) MG tablet  Commonly known as:  OS-CAL - dosed in mg of elemental calcium  Take 2 tablets (1,000 mg of elemental calcium total) by mouth 2 (two) times daily with a meal.     HYDROcodone-acetaminophen 5-325 MG tablet  Commonly known as:  NORCO/VICODIN  Take 1-2 tablets by mouth every 4 (four) hours as needed for moderate pain.     SYNTHROID 100 MCG tablet  Generic drug:  levothyroxine  Take 1 tablet (100 mcg total) by mouth daily.           Follow-up Information    Follow up with Earnstine Regal, MD. Schedule an appointment as soon as possible for a visit in 3 weeks.   Specialty:  General Surgery   Why:  For wound re-check   Contact information:   [place] [address] [phone]       Earnstine Regal, MD, [facility] Office: [phone]   Signed: Earnstine Regal 06/18/2015, 8:14 AM

## 2015-06-18 NOTE — Anesthesia Postprocedure Evaluation (Signed)
Anesthesia Post Note  Patient: Marc Becker  Procedure(s) Performed: Procedure(s) (LRB): TOTAL THYROIDECTOMY WITH LIMITED LYMPH NODE DISECTION (N/A)  Patient location during evaluation: PACU Anesthesia Type: General Level of consciousness: awake Pain management: pain level controlled Vital Signs Assessment: post-procedure vital signs reviewed and stable Respiratory status: spontaneous breathing Cardiovascular status: stable Anesthetic complications: no    Last Vitals:  Filed Vitals:   06/18/15 0100 06/18/15 0504  BP: 137/66 133/68  Pulse: 62 76  Temp: 36.7 C 37 C  Resp: 19 19    Last Pain:  Filed Vitals:   06/18/15 0853  PainSc: 3                  EDWARDS,Garlan Drewes

## 2015-06-18 NOTE — Progress Notes (Signed)
Patient discharged to home with instructions. 

## 2015-06-22 ENCOUNTER — Ambulatory Visit: Payer: Self-pay | Admitting: Endocrinology

## 2015-06-28 ENCOUNTER — Ambulatory Visit (INDEPENDENT_AMBULATORY_CARE_PROVIDER_SITE_OTHER): Payer: Self-pay | Admitting: Endocrinology

## 2015-06-28 ENCOUNTER — Encounter: Payer: Self-pay | Admitting: Endocrinology

## 2015-06-28 VITALS — BP 114/80 | HR 64 | Temp 98.3°F | Resp 14 | Ht 71.0 in | Wt 155.2 lb

## 2015-06-28 DIAGNOSIS — C73 Malignant neoplasm of thyroid gland: Secondary | ICD-10-CM

## 2015-06-28 NOTE — Progress Notes (Addendum)
Patient ID: Marc Becker, male   DOB: 1965-07-27, 50 y.o.   MRN: [id number]           Reason for Appointment: Follow-up of thyroid cancer    History of Present Illness:   The patient's thyroid enlargement was first discovered in 5/16 and his biopsy was done in 7/16 showing Bethesda category 5  His surgery was done on 06/17/2015 and total thyroidectomy was done. He has done well with the surgery and his previous hoarseness is relatively better now Did not have any shows with hypocalcemia and is still taking calcium supplements  He is now on Synthroid 100 g daily. He does take his calcium supplements at lunch and dinner Did not complain of any fatigue since surgery  PATHOLOGY report:  Dominant tumor on the left: Maximum tumor size (cm): 4.2 cm Histologic type: Classic/usual type, papillary. Tumor capsule: Present.  Extrathyroidal extension: Absent. Capsular invasion with degree of invasion if present: No.  No extrathyroidal extension. Second and additional tumors:1.7 cm and 0.9 cm in the left side Lymph nodes: Positive 1/8 with tumor size less than 0.1 cm TNM code: pT3(m), pN1a   Lab Results  Component Value Date   TSH 0.27* 03/26/2015        Medication List       This list is accurate as of: 06/28/15  8:25 PM.  Always use your most recent med list.               calcium carbonate 1250 (500 Ca) MG tablet  Commonly known as:  OS-CAL - dosed in mg of elemental calcium  Take 2 tablets (1,000 mg of elemental calcium total) by mouth 2 (two) times daily with a meal.     HYDROcodone-acetaminophen 5-325 MG tablet  Commonly known as:  NORCO/VICODIN  Take 1-2 tablets by mouth every 4 (four) hours as needed for moderate pain.     SYNTHROID 100 MCG tablet  Generic drug:  levothyroxine  Take 1 tablet (100 mcg total) by mouth daily.        Allergies: No Known Allergies  Past Medical History  Diagnosis Date  . Thyroid disease   . Ascending aorta enlargement ([place])       10/05/14 CT [facility]): 3.8 cm ascending aorta    Past Surgical History  Procedure Laterality Date  . Hernia repair Left     inguinal  . Thyroidectomy N/A 06/17/2015    Procedure: TOTAL THYROIDECTOMY WITH LIMITED LYMPH NODE DISECTION;  Surgeon: Armandina Gemma, MD;  Location: [facility] OR;  Service: General;  Laterality: N/A;    Family History  Problem Relation Age of Onset  . Thyroid disease Mother   . Thyroid disease Sister   . Thyroid disease Maternal Grandmother    No history of cancer in the family  Social History:  reports that he has been smoking.  He has never used smokeless tobacco. He reports that he does not drink alcohol or use illicit drugs.   Review of Systems:  Review of Systems            Examination:   BP 114/80 mmHg  Pulse 64  Temp(Src) 98.3 F (36.8 C)  Resp 14  Ht 5\' 11"  (1.803 m)  Wt 155 lb 3.2 oz (70.398 kg)  BMI 21.66 kg/m2  SpO2 97%   He looks well. Scar well-healed    Assessment/Plan:  THYROID cancer: Left-sided, papillary type with only minimal lymph node metastasis Given his pathological features his tumor is low risk,  has no aggressive features of capsular invasion or vascular invasion and there is no extrathyroidal extension   However given the relatively large size of the tumor and to facilitate follow-up will schedule him for remnant ablation with 30 mCi  Will get his thyroglobulin level today and this will help [place] assess extent of thyroid remnant and tumor burden if any  We'll also schedule him for follow-up thyroid levels in about 6 weeks Given him handout about  radioactive iodine  treatment which will be also scheduled in about 6 weeks  He can stop calcium supplements when finished as the did not have any postoperative hypoparathyroidism  Marc Becker 06/28/2015   Addendum: Thyroglobulin is <2.0, will only give him 30 mCi

## 2015-07-05 LAB — THYROGLOBULIN LEVEL: Thyroglobulin (TG-RIA): <2 ng/mL

## 2015-07-09 NOTE — Progress Notes (Signed)
Quick Note:  Please let patient know that the thyroid cancer test is in the low range indicating adequate surgery. He will be scheduled for radioactive iodine treatment and can do this in about a month ______

## 2015-07-09 NOTE — Addendum Note (Signed)
Addended by: Elayne Snare on: 07/09/2015 01:01 PM   Modules accepted: Orders

## 2015-07-13 ENCOUNTER — Telehealth: Payer: Self-pay | Admitting: *Deleted

## 2015-07-13 NOTE — Telephone Encounter (Signed)
Stanton Kidney, [facility] called stating the [place] said that Dr Dwyane Dee needs to order a "post treatment scan" for this patient. Be advised.

## 2015-07-13 NOTE — Telephone Encounter (Signed)
I do not feel that this is necessary for this patient

## 2015-07-14 ENCOUNTER — Other Ambulatory Visit: Payer: Self-pay | Admitting: Endocrinology

## 2015-07-14 ENCOUNTER — Telehealth: Payer: Self-pay | Admitting: *Deleted

## 2015-07-14 DIAGNOSIS — C73 Malignant neoplasm of thyroid gland: Secondary | ICD-10-CM

## 2015-07-14 NOTE — Telephone Encounter (Signed)
Ordered

## 2015-07-14 NOTE — Telephone Encounter (Signed)
Amber from [facility] called and needs you to put in post therapy scan for this patient please.

## 2015-07-14 NOTE — Telephone Encounter (Signed)
Marc Becker, [facility] the message to let her know.

## 2015-07-15 NOTE — Telephone Encounter (Signed)
Noted  

## 2015-07-23 ENCOUNTER — Ambulatory Visit (INDEPENDENT_AMBULATORY_CARE_PROVIDER_SITE_OTHER): Payer: Self-pay | Admitting: Endocrinology

## 2015-07-23 ENCOUNTER — Encounter: Payer: Self-pay | Admitting: Endocrinology

## 2015-07-23 ENCOUNTER — Other Ambulatory Visit: Payer: Self-pay | Admitting: *Deleted

## 2015-07-23 VITALS — BP 118/78 | HR 70 | Temp 98.2°F | Resp 14 | Ht 71.0 in | Wt 158.6 lb

## 2015-07-23 DIAGNOSIS — C73 Malignant neoplasm of thyroid gland: Secondary | ICD-10-CM

## 2015-07-23 DIAGNOSIS — E041 Nontoxic single thyroid nodule: Secondary | ICD-10-CM

## 2015-07-23 LAB — TSH: TSH: 7.9 u[IU]/mL — AB (ref 0.35–4.50)

## 2015-07-23 MED ORDER — LEVOTHYROXINE SODIUM 125 MCG PO TABS: 125.0000 ug | ORAL_TABLET | Freq: Every day | ORAL | Status: DC

## 2015-07-23 NOTE — Progress Notes (Signed)
Patient ID: Marc Becker, male   DOB: [birth year], 50 y.o.   MRN: [id number]           Reason for Appointment: Follow-up of thyroid cancer    History of Present Illness:   The patient's thyroid enlargement was first discovered in 5/16 and his biopsy was done in 7/16 showing Bethesda category 5  His surgery was done on 06/17/2015 and total thyroidectomy was done. He has done well with the surgery and his previous hoarseness is slightly better now He has been scheduled for remnant ablation with 30 mCi  PATHOLOGY report:  Dominant tumor on the left: Maximum tumor size (cm): 4.2 cm Histologic type: Classic/usual type, papillary. Tumor capsule: Present.  Extrathyroidal extension: Absent. Capsular invasion with degree of invasion if present: No.  No extrathyroidal extension. Second and additional tumors:1.7 cm and 0.9 cm in the left side Lymph nodes: Positive 1/8 with tumor size less than 0.1 cm TNM code: pT3(m), pN1a  HYPOTHYROIDISM:  He is now on Synthroid 100 g daily since his surgery. He does not complain of any fatigue, cold intolerance or weight change Has been quite regular with taking his medication before breakfast  Lab Results  Component Value Date   TSH 0.27* 03/26/2015        Medication List       This list is accurate as of: 07/23/15 12:07 PM.  Always use your most recent med list.               SYNTHROID 100 MCG tablet  Generic drug:  levothyroxine  Take 1 tablet (100 mcg total) by mouth daily.        Allergies: No Known Allergies  Past Medical History  Diagnosis Date  . Thyroid disease   . 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 Hernia repair Left     inguinal  . Thyroidectomy N/A 06/17/2015    Procedure: TOTAL THYROIDECTOMY WITH LIMITED LYMPH NODE DISECTION;  Surgeon: Armandina Gemma, MD;  Location: [facility] OR;  Service: General;  Laterality:  N/A;    Family History  Problem Relation Age of Onset  . Thyroid disease Mother   . Thyroid disease Sister   . Thyroid disease Maternal Grandmother    No history of cancer in the family  Social History:  reports that he has been smoking.  He has never used smokeless tobacco. He reports that he does not drink alcohol or use illicit drugs. He is concerned about his financial situation with limited income and increasing debts   ROS          Examination:   BP 118/78 mmHg  Pulse 70  Temp(Src) 98.2 F (36.8 C)  Resp 14  Ht 5\' 11"  (1.803 m)  Wt 158 lb 9.6 oz (71.94 kg)  BMI 22.13 kg/m2  SpO2 98%   He looks well. Scar well-healed  Biceps reflexes appear normal   Assessment/Plan:  HYPOTHYROIDISM: He has not had a follow-up TSH since his surgery and will check this today and decide on his Synthroid dosage Will try to keep his TSH about 1 or less   THYROID cancer: Left-sided, papillary type with only minimal lymph node metastasis Given his pathological features his tumor is low risk, has no aggressive features of capsular invasion or vascular invasion and there is no extrathyroidal extension  However given the relatively large size of the tumor  and to facilitate follow-up he will get remnant ablation with 30 mCi Discussed the details of this and also why he will get Thyrogen injection  Explained to the patient that his thyroglobulin level is below 2.0 which is good prognostic sign and also indicating adequate surgery He wants to proceed with the treatment now  [facility] 07/23/2015

## 2015-07-23 NOTE — Progress Notes (Signed)
Quick Note:  Please let patient know that the thyroid level is low, he needs to start taking Generic Synthroid 125 g instead of 100 Will also need follow-up testing about 2 months, may do lab only ______

## 2015-07-27 ENCOUNTER — Encounter (HOSPITAL_COMMUNITY): Payer: Self-pay

## 2015-07-28 ENCOUNTER — Encounter (HOSPITAL_COMMUNITY): Payer: Self-pay

## 2015-07-29 ENCOUNTER — Encounter (HOSPITAL_COMMUNITY): Payer: Self-pay

## 2015-08-04 ENCOUNTER — Ambulatory Visit: Payer: Self-pay | Admitting: Endocrinology

## 2015-08-23 ENCOUNTER — Encounter (HOSPITAL_COMMUNITY): Payer: Self-pay

## 2016-12-22 ENCOUNTER — Encounter: Payer: Self-pay | Admitting: Internal Medicine

## 2016-12-22 ENCOUNTER — Ambulatory Visit: Payer: Self-pay | Attending: Internal Medicine | Admitting: Internal Medicine

## 2016-12-22 ENCOUNTER — Encounter (HOSPITAL_COMMUNITY): Payer: Self-pay

## 2016-12-22 VITALS — BP 123/81 | HR 54 | Temp 97.4°F | Resp 16 | Wt 154.8 lb

## 2016-12-22 DIAGNOSIS — Z85850 Personal history of malignant neoplasm of thyroid: Secondary | ICD-10-CM | POA: Insufficient documentation

## 2016-12-22 DIAGNOSIS — Z833 Family history of diabetes mellitus: Secondary | ICD-10-CM | POA: Insufficient documentation

## 2016-12-22 DIAGNOSIS — R918 Other nonspecific abnormal finding of lung field: Secondary | ICD-10-CM

## 2016-12-22 DIAGNOSIS — Z0001 Encounter for general adult medical examination with abnormal findings: Secondary | ICD-10-CM | POA: Insufficient documentation

## 2016-12-22 DIAGNOSIS — E890 Postprocedural hypothyroidism: Secondary | ICD-10-CM | POA: Insufficient documentation

## 2016-12-22 DIAGNOSIS — F17200 Nicotine dependence, unspecified, uncomplicated: Secondary | ICD-10-CM | POA: Insufficient documentation

## 2016-12-22 DIAGNOSIS — Z1211 Encounter for screening for malignant neoplasm of colon: Secondary | ICD-10-CM | POA: Insufficient documentation

## 2016-12-22 DIAGNOSIS — R911 Solitary pulmonary nodule: Secondary | ICD-10-CM | POA: Insufficient documentation

## 2016-12-22 DIAGNOSIS — F17210 Nicotine dependence, cigarettes, uncomplicated: Secondary | ICD-10-CM | POA: Insufficient documentation

## 2016-12-22 HISTORY — DX: Postprocedural hypothyroidism: E89.0

## 2016-12-22 NOTE — Patient Instructions (Signed)
Please correct the spelling of patient's last name and request merge of his two charts in [place].   Call 1-800-Quit-Now to request nicotine patches.   You have been referred for CAT scan of your chest.

## 2016-12-22 NOTE — Progress Notes (Signed)
Patient ID: Marc Becker, male    DOB: [birth year]  MRN: [id number]  CC: New Patient (Initial Visit)  Subjective: Marc Becker is a 51 y.o. male who presents for new pt visit. No family doctor. "I don't like going to doctors." His concerns today include:  1.  Hx of papillary thyroid CA  With minimal LN metastasis s/p total thyroidectomy 06/2015 by Dr. Armandina Gemma. He was seen by [facility] endocrinologist Dr. Stevie Kern in 07/2015 and plan was to proceed with remnant ablation but patient subsequently did not have it done. Currently on Levothyroxine 100 mcg.  Told he needs higher dose of 125 mcg but higher doses  "causes swelling in my back." Has hoarseness that was present before surgery.  -His surgeon Dr. Dot Lanes advised that he establish care here for subsequent refill on his medication -was out for about 5 days.  Restarted about 1-2 mths ago. -has F/u  With [facility] on 01/03/2017 at which time TSH and thyroglobulin level will be rechecked -no feeling cold or hot, diarrhea/constipation or palpitations. No fatigue.   2.  Spot on LT side of lung discovered on xray done spring 2017 Smokes 1/2-1 pk a day since age 23.  -no cough or SOB -no wgh changes Tried to quit in past was only able to maintain for 2-3 days -would like to quit.    No current outpatient prescriptions on file prior to visit.   No current facility-administered medications on file prior to visit.     No Known Allergies  Social History   Social History  . Marital status: Single    Spouse name: Marc Becker  . Number of children: Marc Becker  . Years of education: Marc Becker   Occupational History  . Not on file.   Social History Main Topics  . Smoking status: Current Every Day Smoker    Packs/day: 1.00    Types: Cigarettes  . Smokeless tobacco: Never Used  . Alcohol use Yes     Comment: occasionaly  . Drug use: Yes    Frequency: 7.0 times per week    Types: Marijuana     Comment: daily  . Sexual activity: Not on file   Other  Topics Concern  . Not on file   Social History Narrative  . No narrative on file    Family History  Problem Relation Age of Onset  . Diabetes Mother   . Deep vein thrombosis Father     Past Surgical History:  Procedure Laterality Date  . HERNIA REPAIR Left    inguinal hernia    ROS: Review of Systems  Constitutional: Negative for appetite change, diaphoresis and fatigue.  HENT: Negative for congestion.        Positive mild hoarseness which is not new and reportedly is no worse  Eyes: Negative for visual disturbance.  Respiratory: Negative for cough, chest tightness and shortness of breath.   Cardiovascular: Negative for chest pain, palpitations and leg swelling.  Gastrointestinal: Negative for abdominal pain.  Psychiatric/Behavioral: Negative for dysphoric mood. The patient is not nervous/anxious.     PHYSICAL EXAM: BP 123/81   Pulse (!) 54   Temp (!) 97.4 F (36.3 C) (Oral)   Resp 16   Wt 154 lb 12.8 oz (70.2 kg)   SpO2 100%   Physical Exam General appearance - alert, well appearing, Middle-age Caucasian male and in no distress Mental status - alert, oriented to person, place, and time, normal mood, behavior, speech, dress, motor activity, and thought processes Eyes -  pupils equal and reactive, extraocular eye movements intact Mouth - mucous membranes moist, pharynx normal without lesions Neck - supple, no significant adenopathy.  Chest - clear to auscultation, no wheezes, rales or rhonchi, symmetric air entry Heart - normal rate, regular rhythm, normal S1, S2, no murmurs, rubs, clicks or gallops Extremities - peripheral pulses normal, no pedal edema, no clubbing or cyanosis  No flowsheet data found.  PHQ and GAD not entered in chart by CMA at time of this note.  ASSESSMENT AND PLAN: 1. Postoperative hypothyroidism 2. Hx of thyroid cancer -Patient declined having TSH and thyroglobulin level drawn today. States he preferred to wait until he sees Dr. Harlow Asa later  this month -Continue levothyroxine. Patient advised that in patients with history of thyroid cancer it is best to keep her TSH level low to help prevent recurrence  so on repeat blood work if Dr. Lynford Humphrey recommends increasing the levothyroxine dose he should go without it  3. Lung nodule, solitary - CT CHEST LUNG CA SCREEN LOW DOSE W/O CM; Future  4. Tobacco dependence Patient advised to quit smoking. Discussed health risks associated with smoking including lung and other types of cancers, chronic lung diseases and CV risks.. Pt  ready to give trail of quitting.  Discussed methods to help quit including quitting cold [place], use of NRT, Chantix and Bupropion. Would like to try the nicotine patches. Given information about 1 800 quit now and encouraged to call to get the patches - CBC - Comprehensive metabolic panel - Lipid panel  5. Colon cancer screening - Fecal occult blood, imunochemical  6. Abnormal findings on diagnostic imaging of lung - CT CHEST LUNG CA SCREEN LOW DOSE W/O CM; Future  Of note patient's last name is spelled incorrectly. Last name is felt [place]. This has created 2 charts in [place]. Front desk notified so that these charts can be merged. Patient was given the opportunity to ask questions.  Patient verbalized understanding of the plan and was able to repeat key elements of the plan.   Orders Placed This Encounter  Procedures  . Fecal occult blood, imunochemical  . CT CHEST LUNG CA SCREEN LOW DOSE W/O CM  . CBC  . Comprehensive metabolic panel  . Lipid panel     Requested Prescriptions    No prescriptions requested or ordered in this encounter    Return in about 5 months (around 05/24/2017).  Karle Plumber, MD, FACP

## 2016-12-23 LAB — LIPID PANEL
Chol/HDL Ratio: 2.8 ratio (ref 0.0–5.0)
Cholesterol, Total: 142 mg/dL (ref 100–199)
HDL: 50 mg/dL (ref >39)
LDL CALC: 73 mg/dL (ref 0–99)
TRIGLYCERIDES: 95 mg/dL (ref 0–149)
VLDL Cholesterol Cal: 19 mg/dL (ref 5–40)

## 2016-12-23 LAB — CBC
HEMATOCRIT: 46.9 % (ref 37.5–51.0)
Hemoglobin: 15 g/dL (ref 13.0–17.7)
MCH: 29.1 pg (ref 26.6–33.0)
MCHC: 32 g/dL (ref 31.5–35.7)
MCV: 91 fL (ref 79–97)
Platelets: 336 10*3/uL (ref 150–379)
RBC: 5.16 x10E6/uL (ref 4.14–5.80)
RDW: 13.8 % (ref 12.3–15.4)
WBC: 10.2 10*3/uL (ref 3.4–10.8)

## 2016-12-23 LAB — COMPREHENSIVE METABOLIC PANEL
ALK PHOS: 52 IU/L (ref 39–117)
ALT: 15 IU/L (ref 0–44)
AST: 19 IU/L (ref 0–40)
Albumin/Globulin Ratio: 2.2 (ref 1.2–2.2)
Albumin: 4.9 g/dL (ref 3.5–5.5)
BUN / CREAT RATIO: 13 (ref 9–20)
BUN: 12 mg/dL (ref 6–24)
Bilirubin Total: 0.6 mg/dL (ref 0.0–1.2)
CHLORIDE: 103 mmol/L (ref 96–106)
CO2: 23 mmol/L (ref 20–29)
CREATININE: 0.9 mg/dL (ref 0.76–1.27)
Calcium: 9.7 mg/dL (ref 8.7–10.2)
GFR calc Af Amer: 113 mL/min/{1.73_m2} (ref >59)
GFR calc non Af Amer: 98 mL/min/{1.73_m2} (ref >59)
GLOBULIN, TOTAL: 2.2 g/dL (ref 1.5–4.5)
GLUCOSE: 77 mg/dL (ref 65–99)
Potassium: 4.7 mmol/L (ref 3.5–5.2)
SODIUM: 143 mmol/L (ref 134–144)
Total Protein: 7.1 g/dL (ref 6.0–8.5)

## 2017-01-03 ENCOUNTER — Telehealth: Payer: Self-pay | Admitting: Internal Medicine

## 2017-01-03 NOTE — Telephone Encounter (Signed)
[facility] called , pt needs 2 test done in our office TSH, and thyroglobulin. In order for our lab to scheduled we need an order from PCP. Also requesting status of CT scan, please f/up

## 2017-01-05 ENCOUNTER — Telehealth: Payer: Self-pay | Admitting: Internal Medicine

## 2017-01-05 DIAGNOSIS — Z85850 Personal history of malignant neoplasm of thyroid: Secondary | ICD-10-CM

## 2017-01-05 NOTE — Telephone Encounter (Signed)
Will forward to pcp

## 2017-01-05 NOTE — Telephone Encounter (Signed)
Contacted to inform him of the future lab work that Dr. Wynetta Emery put in the system. Was unable to get in touch with patient due to vm not being set up

## 2017-01-05 NOTE — Telephone Encounter (Signed)
I received a note from patient's surgeon Dr. Armandina Gemma of [facility] surgery. He saw patient in follow-up on 01/03/2017. He has recommended that the patient has TSH and thyroglobulin level checked at our lab and that patient follow through with having CAT scan of his chest for evaluation of pulmonary nodule. I will have my CMA contact the patient to let him know that he can, as lab only visit to have a lab levels drawn. Goal is to keep TSH between 0.5-1.

## 2017-01-09 ENCOUNTER — Telehealth: Payer: Self-pay | Admitting: Internal Medicine

## 2017-01-09 DIAGNOSIS — R918 Other nonspecific abnormal finding of lung field: Secondary | ICD-10-CM

## 2017-01-09 NOTE — Telephone Encounter (Signed)
Pt is not the age requirement for CT

## 2017-01-10 NOTE — Addendum Note (Signed)
Addended by: Karle Plumber B on: 01/10/2017 12:46 PM   Modules accepted: Orders

## 2017-01-10 NOTE — Telephone Encounter (Signed)
Pt is schedule for a CT on 01-18-17 @ 330 pt is to arrive at 315 at [facility]. Tried contacting pt to inform him of the appointment pt didn't answer and vm is not set up

## 2017-01-18 ENCOUNTER — Ambulatory Visit (HOSPITAL_COMMUNITY): Payer: Self-pay

## 2017-01-26 ENCOUNTER — Ambulatory Visit (HOSPITAL_COMMUNITY)
Admission: RE | Admit: 2017-01-26 | Discharge: 2017-01-26 | Disposition: A | Discharge status: Home / Self Care | Payer: Self-pay | Source: Ambulatory Visit | Attending: Internal Medicine | Admitting: Internal Medicine

## 2017-01-26 DIAGNOSIS — I77810 Thoracic aortic ectasia: Secondary | ICD-10-CM | POA: Insufficient documentation

## 2017-01-26 DIAGNOSIS — J439 Emphysema, unspecified: Secondary | ICD-10-CM | POA: Insufficient documentation

## 2017-01-26 DIAGNOSIS — R918 Other nonspecific abnormal finding of lung field: Secondary | ICD-10-CM

## 2017-01-26 DIAGNOSIS — I700 Atherosclerosis of aorta: Secondary | ICD-10-CM | POA: Insufficient documentation

## 2017-01-30 ENCOUNTER — Encounter: Payer: Self-pay | Admitting: Internal Medicine

## 2017-01-30 NOTE — Progress Notes (Signed)
Letter sent to pt with results of CT scan chest.  Unable to reach him via phone. Recording that voicemail box not set up.

## 2017-10-30 IMAGING — CR DG CHEST 2V
2 series · 2 of 2 positions shown · non-contrast
Comparison: Chest CT scan October 05, 2014

CLINICAL DATA: Smoker, preoperative study, anticipated total
thyroidectomy with lymph node dissection.

EXAM:
CHEST  2 VIEW

[w chest pa]
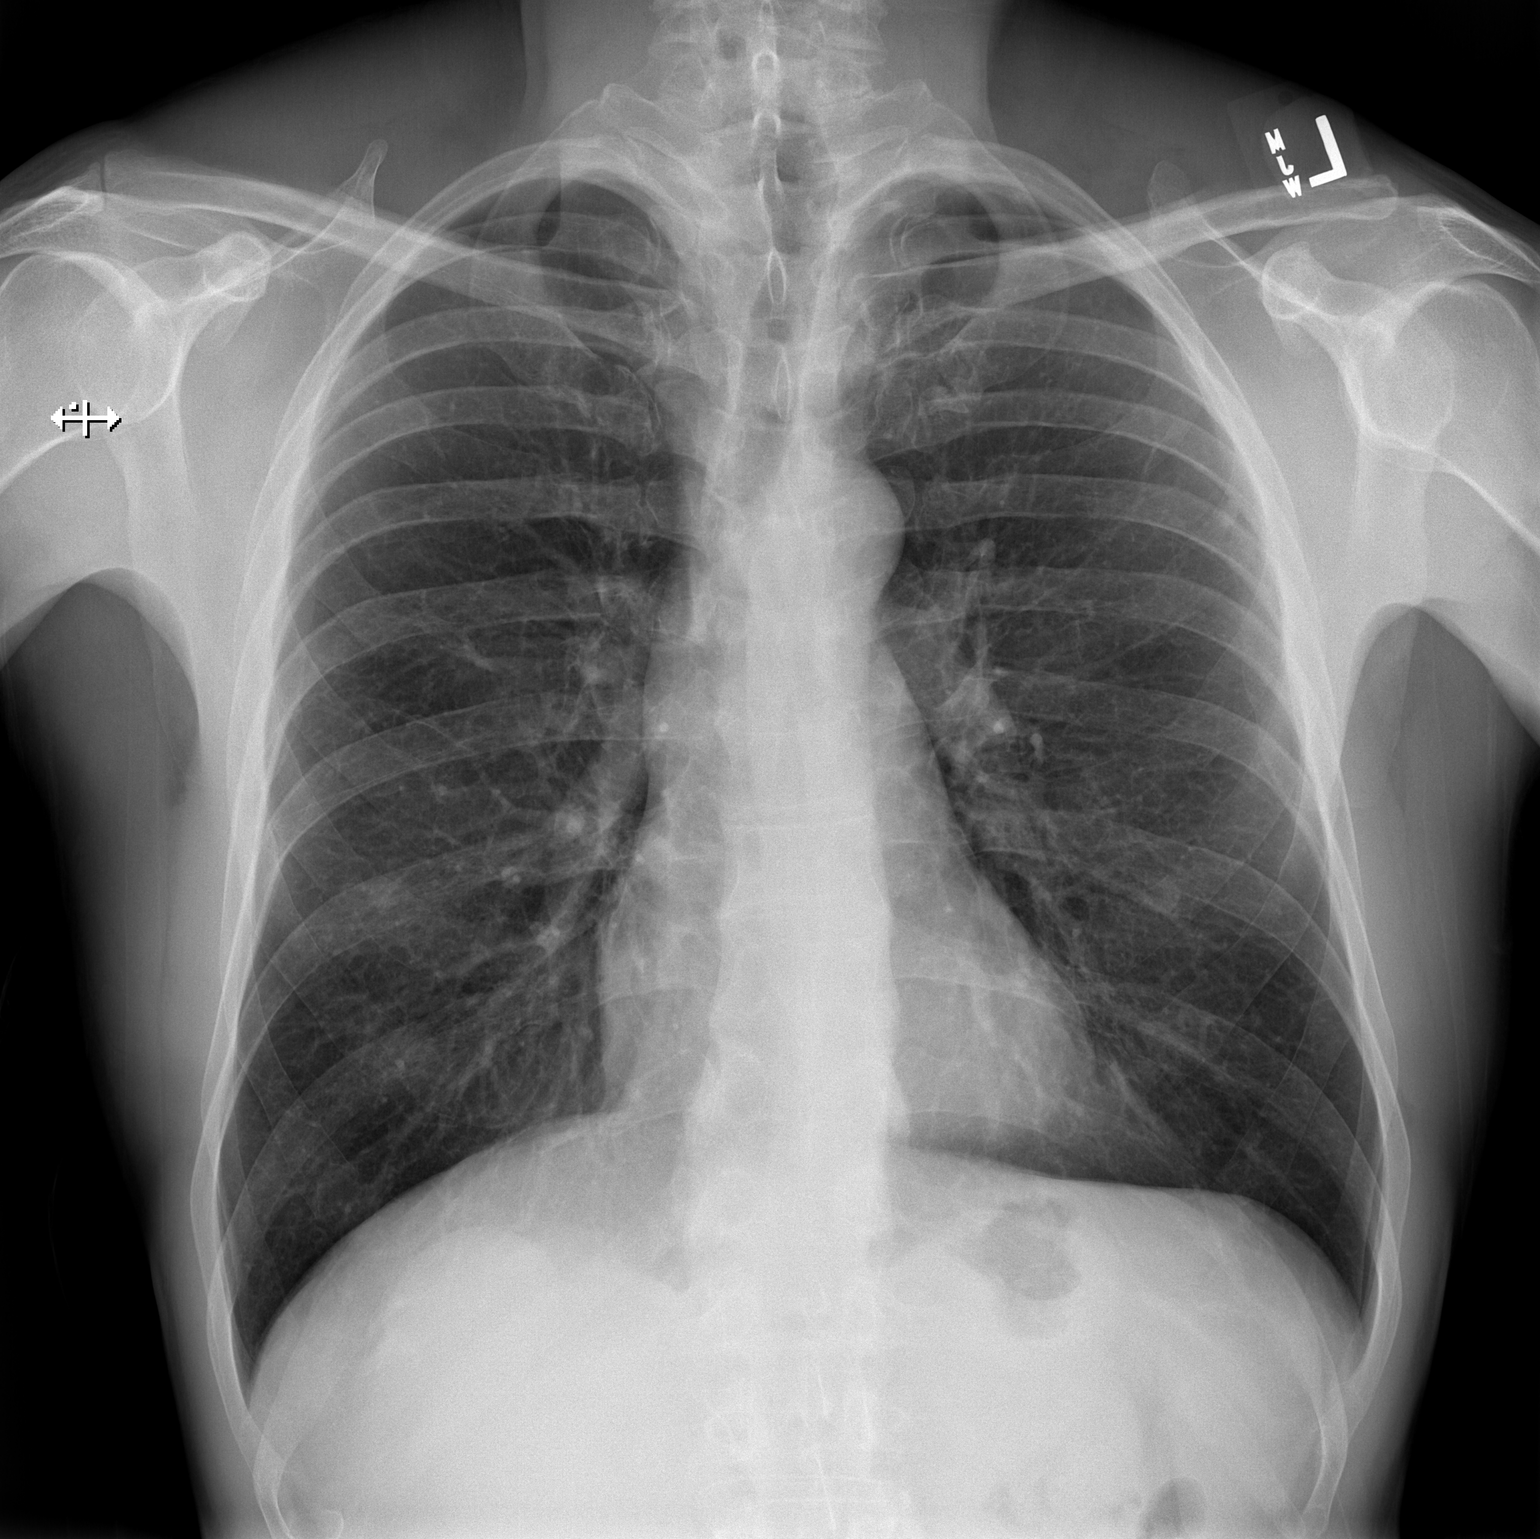

[w chest lat]
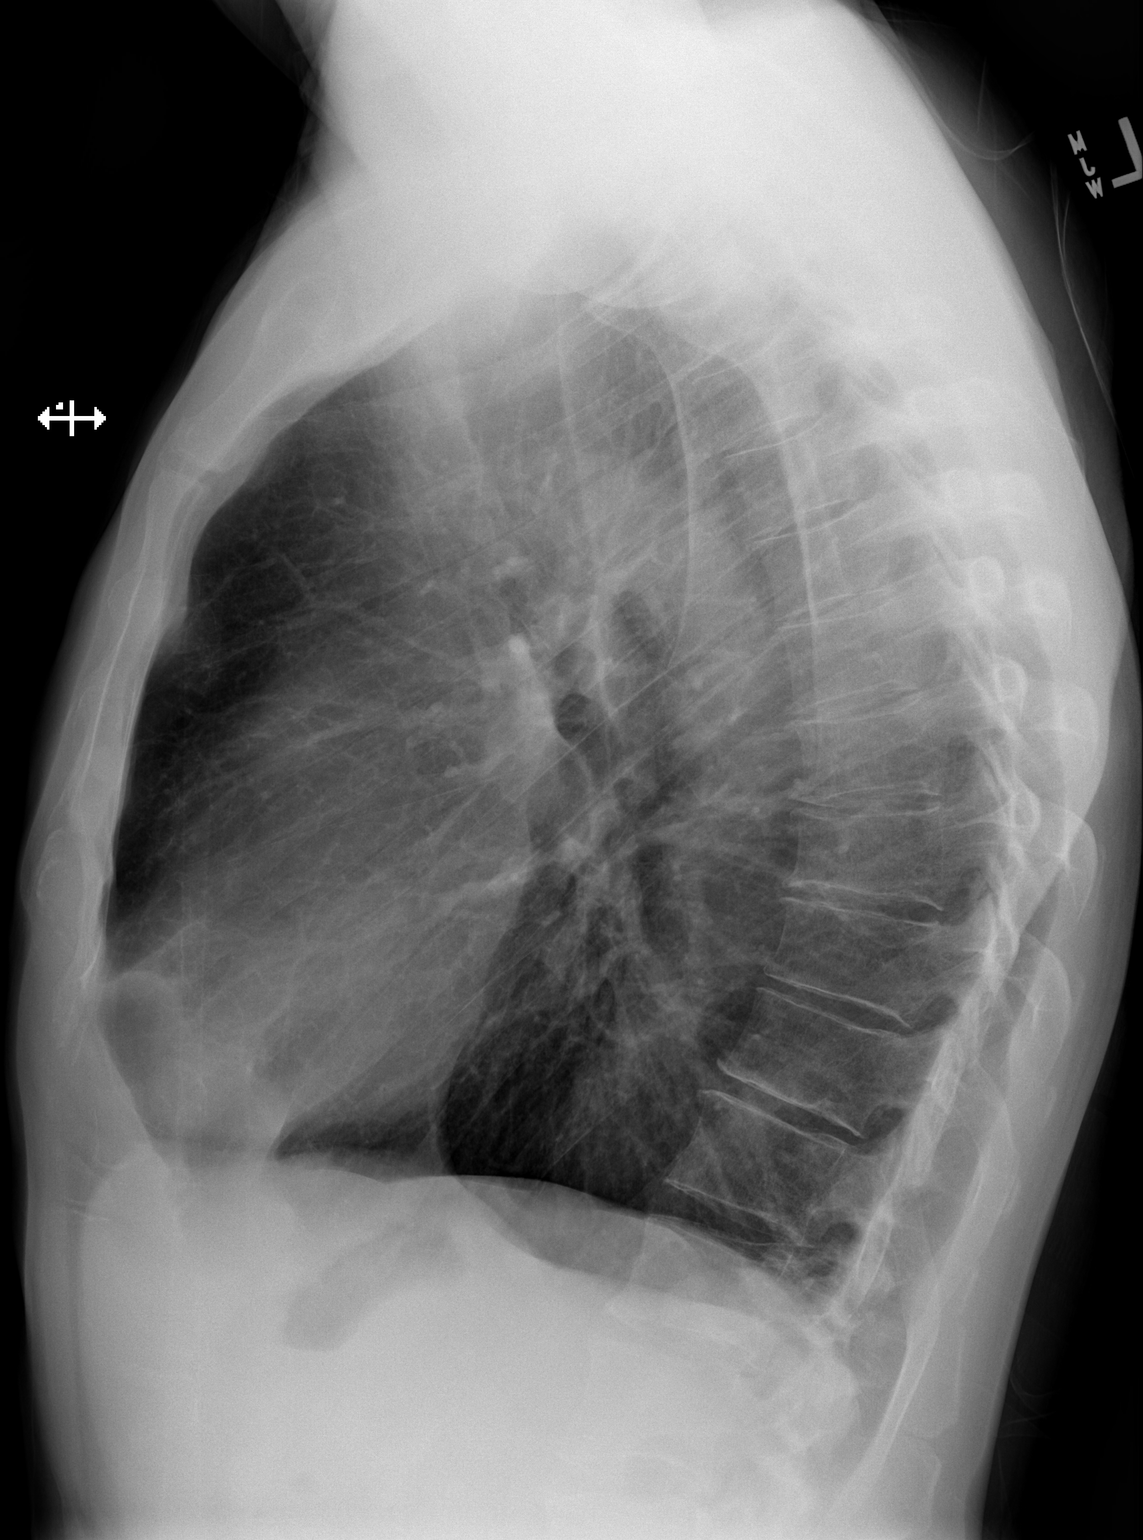

[2 of 2 positions shown; findings below may reference images not displayed]

FINDINGS: The lungs are mildly hyperinflated with hemidiaphragm flattening and
increased AP dimension of the thorax. There is an approximately 4 mm
diameter nodular density projecting over the lateral aspect of the
left 6 rib. This may be faintly calcified. Nipple shadows are
visible bilaterally. The heart and pulmonary vascularity are normal.
The mediastinum is normal in width. There is mild deviation of the
trachea toward the left. The thoracic spine exhibits no acute
abnormality.
IMPRESSION: 1. Hyperinflation consistent with COPD in the patient's history of
tobacco use. Subcentimeter nodule peripherally in the left lung.
This nodule was not clearly evident on the previous CT scan. There
are no previous chest x-rays for review. This will merit
postoperative follow-up CT scanning.
2. Mild deviation of the trachea toward the left likely due to the
enlarged thyroid gland.

## 2019-01-22 ENCOUNTER — Encounter: Payer: Self-pay | Admitting: Gastroenterology

## 2019-02-07 DIAGNOSIS — E039 Hypothyroidism, unspecified: Secondary | ICD-10-CM

## 2019-02-07 HISTORY — DX: Hypothyroidism, unspecified: E03.9

## 2019-02-10 NOTE — Progress Notes (Deleted)
Cardiology Office Note:    Date:  02/10/2019   ID:  Marc Becker, DOB [birth year], MRN [id number]  PCP:  Marc Pier, MD  Cardiologist:  Marc More, MD   Referring MD: Marc Pier, MD  ASSESSMENT:    No diagnosis found. PLAN:    In order of problems listed above:  1. ***  Next appointment   Medication Adjustments/Labs and Tests Ordered: Current medicines are reviewed at length with the patient today.  Concerns regarding medicines are outlined above.  No orders of the defined types were placed in this encounter.  No orders of the defined types were placed in this encounter.    No chief complaint on file. ***  History of Present Illness:    Marc Becker is a 53 y.o. male who is being seen today for the evaluation of an abnormal EKG at the request of Marc Pier, MD. Reviewed that EKG performed 01/19/2019 shows sinus rhythm 67 bpm and is a normal EKG although is interpreted as borderline right ventricular conduction delay with a normal variant of RSR prime V1 V2 restoration is normal at 87 ms  Past Medical History:  Diagnosis Date  . Ascending aorta enlargement ([place])    10/05/14 CT [facility] 3.8 cm ascending aorta  . Hypothyroidism 02/07/2019  . Papillary thyroid carcinoma ([place]) 06/16/2015  . Postoperative hypothyroidism 12/22/2016  . Thyroid disease   . Thyroid nodule 03/26/2015    Past Surgical History:  Procedure Laterality Date  . HERNIA REPAIR Left    inguinal  . HERNIA REPAIR Left    inguinal hernia  . THYROIDECTOMY N/A 06/17/2015   Procedure: TOTAL THYROIDECTOMY WITH LIMITED LYMPH NODE DISECTION;  Surgeon: Marc Gemma, MD;  Location: [facility];  Service: General;  Laterality: N/A;    Current Medications: No outpatient medications have been marked as taking for the 02/11/19 encounter (Appointment) with Richardo Priest, MD.     Allergies:   Patient has no known allergies.   Social History   Socioeconomic History  .  Marital status: Single    Spouse name: Not on file  . Number of children: Not on file  . Years of education: Not on file  . Highest education level: Not on file  Occupational History  . Not on file  Social Needs  . Financial resource strain: Not on file  . Food insecurity    Worry: Not on file    Inability: Not on file  . Transportation needs    Medical: Not on file    Non-medical: Not on file  Tobacco Use  . Smoking status: Current Every Day Smoker    Packs/day: 1.00    Types: Cigarettes  . Smokeless tobacco: Never Used  . Tobacco comment: smokes 1/2 pack per day.  Substance and Sexual Activity  . Alcohol use: Yes    Comment: occasionaly  . Drug use: Yes    Frequency: 7.0 times per week    Types: Marijuana    Comment: daily  . Sexual activity: Not on file  Lifestyle  . Physical activity    Days per week: Not on file    Minutes per session: Not on file  . Stress: Not on file  Relationships  . Social Herbalist on phone: Not on file    Gets together: Not on file    Attends religious service: Not on file    Active member of club or organization: Not on file  Attends meetings of clubs or organizations: Not on file    Relationship status: Not on file  Other Topics Concern  . Not on file  Social History Narrative   ** Merged History Encounter **         Family History: The patient's ***family history includes Deep vein thrombosis in his father; Diabetes in his mother; Thyroid disease in his maternal grandmother, mother, and sister.  ROS:   Review of Systems  Constitution: Positive for malaise/fatigue.  HENT: Negative.   Eyes: Negative.   Cardiovascular: Positive for palpitations.  Respiratory: Positive for shortness of breath.   Endocrine: Negative.   Hematologic/Lymphatic: Negative.   Skin: Negative.   Musculoskeletal: Negative.   Gastrointestinal: Negative.   Genitourinary: Negative.   Neurological: Positive for dizziness.   Psychiatric/Behavioral: Negative.   Allergic/Immunologic: Negative.    Please see the history of present illness.    *** All other systems reviewed and are negative.  EKGs/Labs/Other Studies Reviewed:    The following studies were reviewed today: *** CT chest 01/26/2017: IMPRESSION: 1. No suspicious appearing pulmonary nodules or masses are noted. 2. Aortic atherosclerosis with ectasia of ascending thoracic aorta which measures 4.1 cm in diameter. Recommend annual imaging followup by CTA or MRA. This recommendation follows 2010 ACCF/AHA/AATS/ACR/ASA/SCA/SCAI/SIR/STS/SVM Guidelines for the Diagnosis and Management of Patients with Thoracic Aortic Disease. Circulation. 2010; 121: [id number]. 3. Mild diffuse bronchial wall thickening with mild centrilobular and paraseptal emphysema.  EKG:  EKG is *** ordered today.  The ekg ordered today is personally reviewed and demonstrates ***  Recent Labs: No results found for requested labs within last 8760 hours.  Recent Lipid Panel    Component Value Date/Time   CHOL 142 12/22/2016 1010   TRIG 95 12/22/2016 1010   HDL 50 12/22/2016 1010   CHOLHDL 2.8 12/22/2016 1010   LDLCALC 73 12/22/2016 1010    Physical Exam:    VS:  There were no vitals taken for this visit.    Wt Readings from Last 3 Encounters:  12/22/16 154 lb 12.8 oz (70.2 kg)  07/23/15 158 lb 9.6 oz (71.9 kg)  06/28/15 155 lb 3.2 oz (70.4 kg)     GEN: *** Well nourished, well developed in no acute distress HEENT: Normal NECK: No JVD; No carotid bruits LYMPHATICS: No lymphadenopathy CARDIAC: ***RRR, no murmurs, rubs, gallops RESPIRATORY:  Clear to auscultation without rales, wheezing or rhonchi  ABDOMEN: Soft, non-tender, non-distended MUSCULOSKELETAL:  No edema; No deformity  SKIN: Warm and dry NEUROLOGIC:  Alert and oriented x 3 PSYCHIATRIC:  Normal affect     Signed, Marc More, MD  02/10/2019 7:59 AM    [place]

## 2019-02-11 ENCOUNTER — Ambulatory Visit: Payer: Self-pay | Admitting: Cardiology

## 2019-02-25 ENCOUNTER — Telehealth: Payer: Self-pay

## 2019-02-25 NOTE — Telephone Encounter (Signed)
Patient No Showed for Pre-Visit today. Patient was called to reschedule but there was a bad connection and the phone service kept breaking up. Patient  States that he will call back. Patient needs to reschedule PV. If patient does not call back, a no show letter will be mailed at the end of the day.   Riki Sheer, LPN ( PV )

## 2019-03-07 ENCOUNTER — Encounter: Payer: Self-pay | Admitting: Gastroenterology

## 2019-06-12 IMAGING — CT CT CHEST NODULE FOLLOW UP LOW DOSE W/O CM
2 of 3 series · 15 of 36 positions shown, 18 images · non-contrast
Comparison: Chest CT 10/05/2014.

CLINICAL DATA: 51-year-old male with history of pulmonary nodule.
Followup study.

EXAM:
CT CHEST WITHOUT CONTRAST
TECHNIQUE: Multidetector CT imaging of the chest was performed following the
standard protocol without IV contrast.

[Series 3: l/d nodule f/u 5.0 st · axial · 0.66mm/px · z∈[+1211,+1501]mm · 12 of 68 slices shown, 15 images]
[im 5/68  mediastinal]
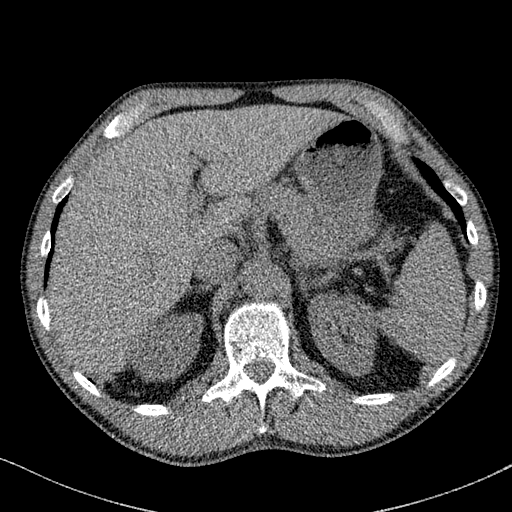
[im 5/68  lung]
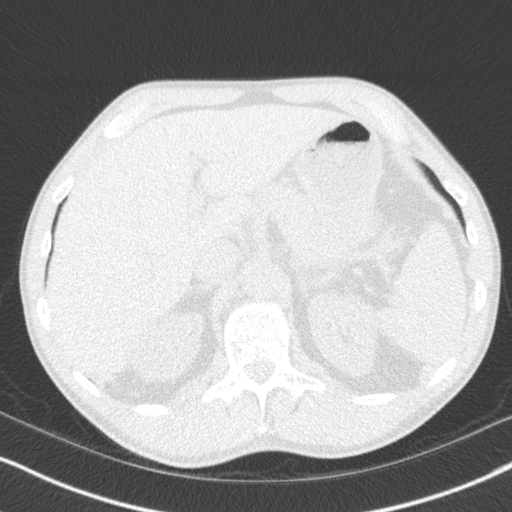
[im 10/68  lung]
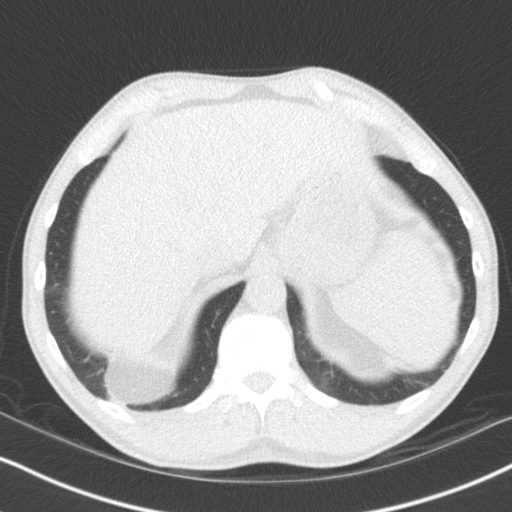
[im 15/68  lung]
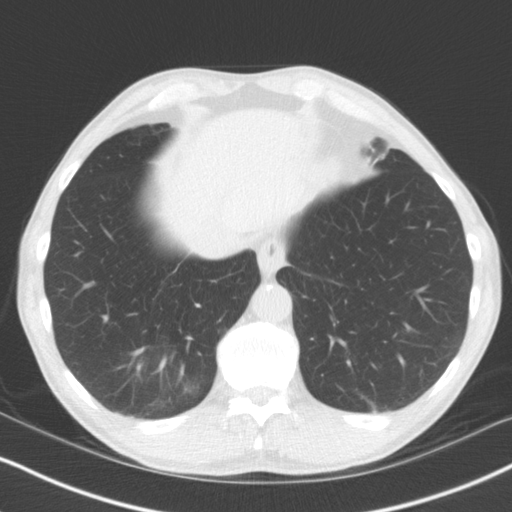
[im 20/68  lung]
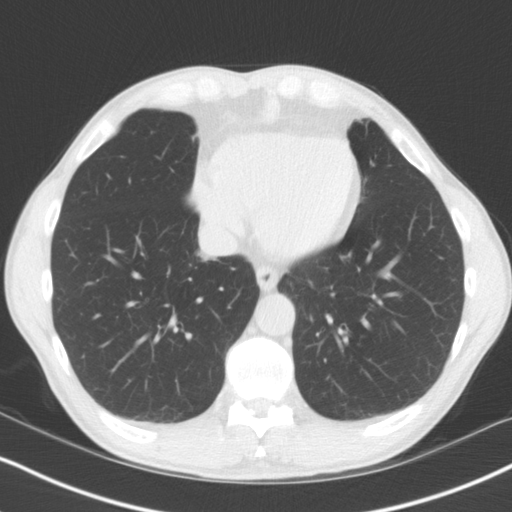
[im 25/68  mediastinal]
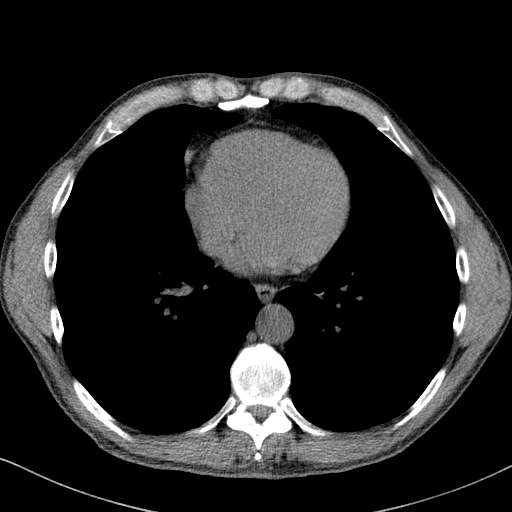
[im 25/68  lung]
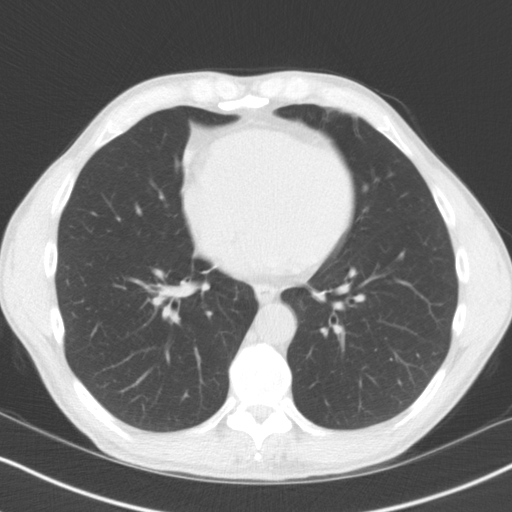
[im 30/68  lung]
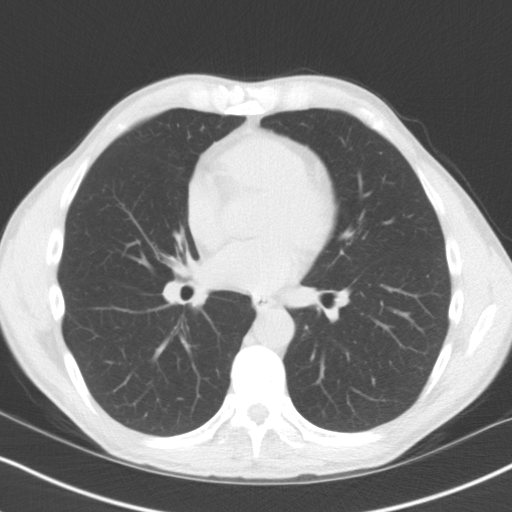
[im 38/68  lung]
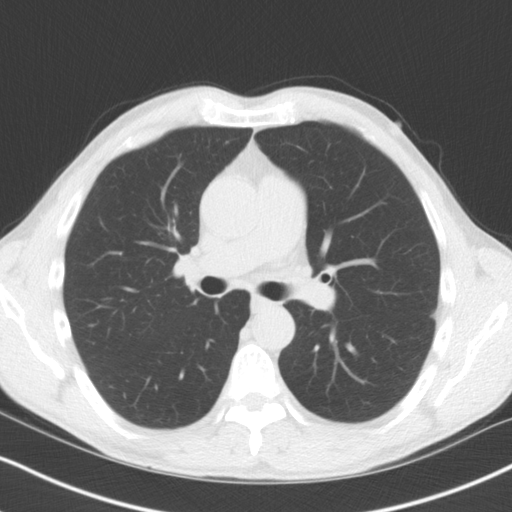
[im 43/68  lung]
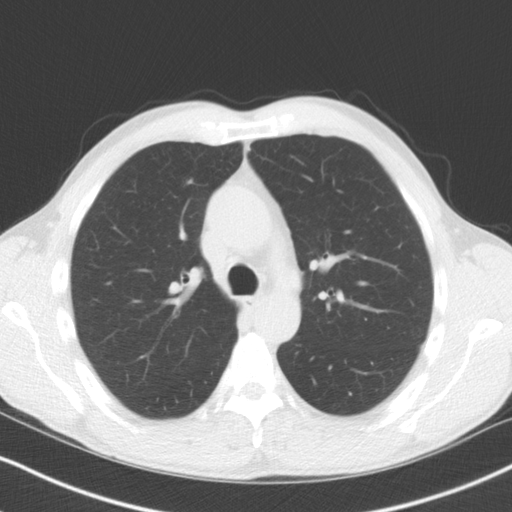
[im 48/68  mediastinal]
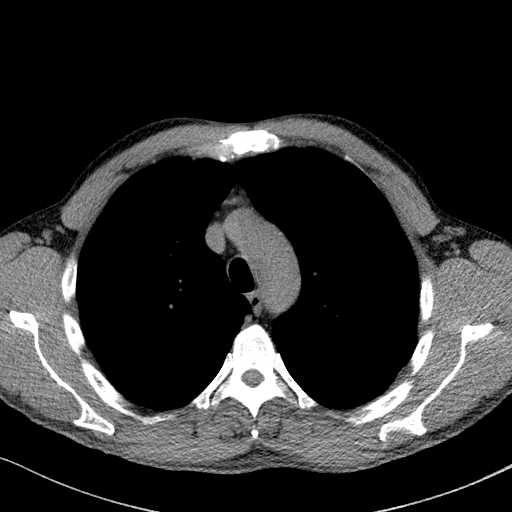
[im 48/68  lung]
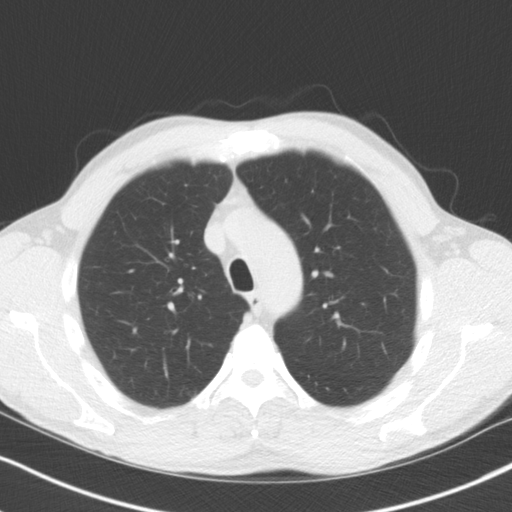
[im 53/68  lung]
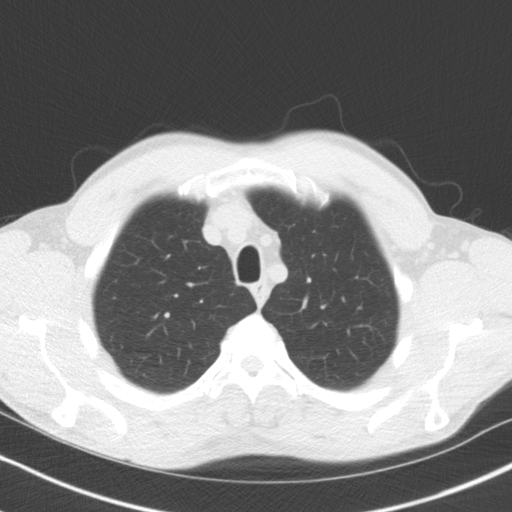
[im 58/68  lung]
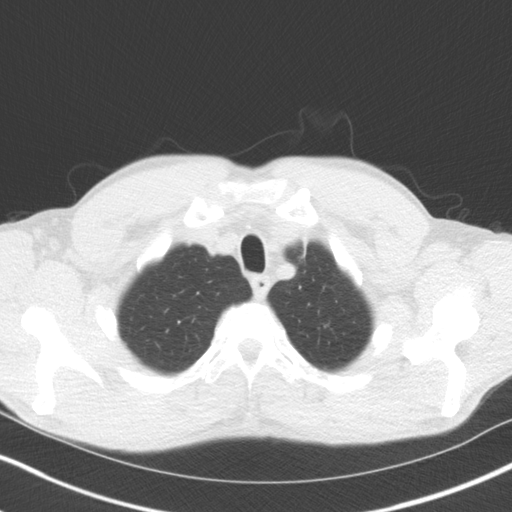
[im 63/68  lung]
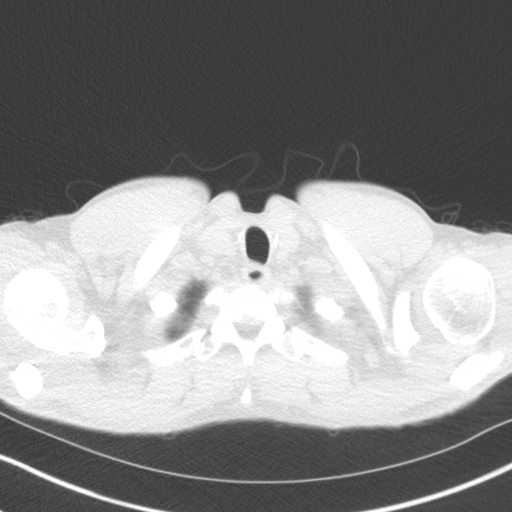

[Series 5: l/d nodule f/u 2.0 cor · coronal · 0.66mm/px · 3 of 151 slices shown]
[im 31/151  lung]
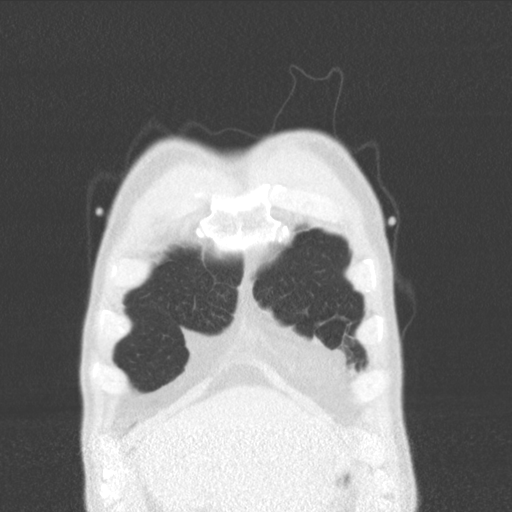
[im 61/151  lung]
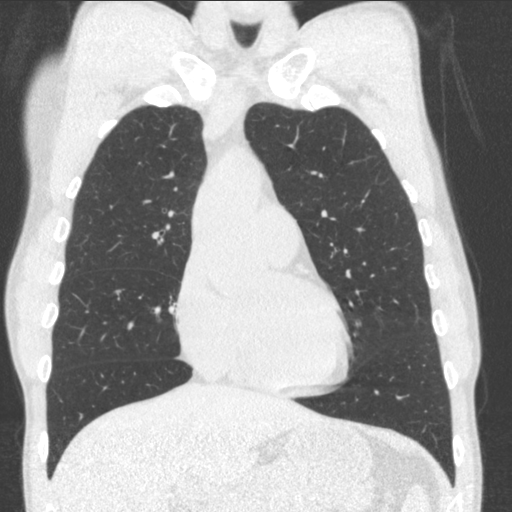
[im 91/151  lung]
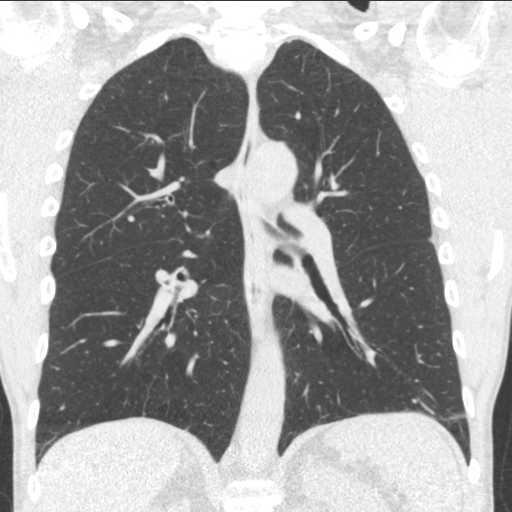

[15 of 36 positions shown; findings below may reference images not displayed]

FINDINGS: Cardiovascular: Heart size is normal. There is no significant
pericardial fluid, thickening or pericardial calcification. Aortic
atherosclerosis with ectasia of the ascending thoracic aorta which
measures up to 4.1 cm in diameter. No coronary artery
calcifications.

Mediastinum/Nodes: No pathologically enlarged mediastinal or hilar
lymph nodes. Please note that accurate exclusion of hilar adenopathy
is limited on noncontrast CT scans. Postoperative changes of
thyroidectomy. Esophagus is unremarkable in appearance. No axillary
lymphadenopathy.

Lungs/Pleura: Small right-sided Bochdalek's hernia. No acute
consolidative airspace disease. No pleural effusions. Mild diffuse
bronchial wall thickening with mild centrilobular and paraseptal
emphysema. No suspicious appearing pulmonary nodules or masses are
noted.

Upper Abdomen: 1.3 cm low-attenuation lesion in segment 3 of the
liver is incompletely characterized on today's noncontrast CT
examination, but appears similar to the prior study, likely to
represent a cyst.

Musculoskeletal: There are no aggressive appearing lytic or blastic
lesions noted in the visualized portions of the skeleton.
IMPRESSION: 1. No suspicious appearing pulmonary nodules or masses are noted.
2. Aortic atherosclerosis with ectasia of ascending thoracic aorta
which measures 4.1 cm in diameter. Recommend annual imaging followup
by CTA or MRA. This recommendation follows 1989
ACCF/AHA/AATS/ACR/ASA/SCA/VILIAM-ANTON/MONFARDINI/POLYTA/THIGPEN Guidelines for the
Diagnosis and Management of Patients with Thoracic Aortic Disease.
Circulation. 1989; 121: [id number].
3. Mild diffuse bronchial wall thickening with mild centrilobular
and paraseptal emphysema.

Aortic Atherosclerosis (HQT5M-P3V.V) and Emphysema (HQT5M-RWB.P).

## 2021-02-26 LAB — COLOGUARD: COLOGUARD: NEGATIVE

## 2022-01-02 ENCOUNTER — Encounter: Payer: 59 | Admitting: Oncology

## 2022-01-02 ENCOUNTER — Inpatient Hospital Stay: Payer: Self-pay | Attending: Internal Medicine

## 2022-12-30 ENCOUNTER — Encounter (HOSPITAL_COMMUNITY): Payer: Self-pay

## 2022-12-30 ENCOUNTER — Other Ambulatory Visit (HOSPITAL_COMMUNITY): Payer: Self-pay

## 2023-01-01 ENCOUNTER — Telehealth: Payer: Self-pay | Admitting: Vascular Surgery

## 2023-01-01 NOTE — Telephone Encounter (Signed)
-----   Message from Lemar Livings sent at 12/30/2022  2:53 PM EDT ----- Marc Becker [id number] 01/02/1966  Please have patient see me in the next couple weeks with right lower extremity atherosclerosis causing pain.  He needs ABIs prior to evaluation.  Referring physician is Dr. Vernelle Emerald in the [facility].  bc

## 2023-01-03 ENCOUNTER — Other Ambulatory Visit: Payer: Self-pay

## 2023-01-03 ENCOUNTER — Ambulatory Visit (HOSPITAL_COMMUNITY)
Admission: RE | Admit: 2023-01-03 | Discharge: 2023-01-03 | Disposition: A | Discharge status: Home / Self Care | Payer: 59 | Source: Ambulatory Visit | Attending: Vascular Surgery | Admitting: Vascular Surgery

## 2023-01-03 ENCOUNTER — Telehealth: Payer: Self-pay

## 2023-01-03 ENCOUNTER — Encounter: Payer: Self-pay | Admitting: Vascular Surgery

## 2023-01-03 DIAGNOSIS — M79606 Pain in leg, unspecified: Secondary | ICD-10-CM | POA: Diagnosis present

## 2023-01-03 LAB — VAS US ABI WITH/WO TBI
Left ABI: 0.34
Right ABI: 0.64

## 2023-01-03 NOTE — Telephone Encounter (Signed)
Pt walked into clinic this morning stating he had an appointment - Pt did not have an appointment scheduled today. Pt had been contacted multiple times by one of our schedulers but was unable to reach him and no VM was available. Pt stated "someone told me to be here at 8:00" I explained that we did receive a message from Dr. Randie Heinz to schedule an OV in 2 weeks with ABI but we were unable to make contact. Pt was upset stating "nobody seems to care about my leg" s/w Myriam Jacobson in lab and she was able to complete his ABI this morning. Pt is scheduled to return to see Dr. Randie Heinz on 9/4 @ 10:40- pt given AVS with appt details.

## 2023-01-03 NOTE — Telephone Encounter (Signed)
Appt has been scheduled.

## 2023-01-05 ENCOUNTER — Telehealth: Payer: Self-pay

## 2023-01-05 ENCOUNTER — Other Ambulatory Visit: Payer: Self-pay

## 2023-01-05 DIAGNOSIS — M79606 Pain in leg, unspecified: Secondary | ICD-10-CM

## 2023-01-05 NOTE — Telephone Encounter (Signed)
Pt called very angry and in pain/numbness of LLE. He is a new pt and wanted to move up his appt. He states he can not work with this pain and is going to lose his job. Pt has been added on with LLE Arterial duplex and to see MD next week. Pt verbalized understanding of these appts.

## 2023-01-10 ENCOUNTER — Encounter: Payer: Self-pay | Admitting: Vascular Surgery

## 2023-01-10 ENCOUNTER — Ambulatory Visit (INDEPENDENT_AMBULATORY_CARE_PROVIDER_SITE_OTHER): Payer: Managed Care, Other (non HMO) | Admitting: Vascular Surgery

## 2023-01-10 ENCOUNTER — Ambulatory Visit (HOSPITAL_COMMUNITY)
Admission: RE | Admit: 2023-01-10 | Discharge: 2023-01-10 | Disposition: A | Discharge status: Home / Self Care | Payer: Managed Care, Other (non HMO) | Source: Ambulatory Visit | Attending: Vascular Surgery | Admitting: Vascular Surgery

## 2023-01-10 VITALS — BP 135/86 | HR 61 | Temp 97.7°F | Resp 16 | Ht 70.0 in | Wt 158.8 lb

## 2023-01-10 DIAGNOSIS — I70222 Atherosclerosis of native arteries of extremities with rest pain, left leg: Secondary | ICD-10-CM

## 2023-01-10 DIAGNOSIS — M79606 Pain in leg, unspecified: Secondary | ICD-10-CM | POA: Insufficient documentation

## 2023-01-10 DIAGNOSIS — M79605 Pain in left leg: Secondary | ICD-10-CM

## 2023-01-10 NOTE — H&P (View-Only) (Signed)
Patient ID: Marc Becker, male   DOB: 1965/10/24, 57 y.o.   MRN: [id number]  Reason for Consult: New Patient (Initial Visit)   Referred by Marcine Matar, MD  Subjective:     HPI:  Marc Becker is a 57 y.o. male without significant history of vascular disease.  He was many years ago to have an increased size of his ascending aorta.  More recently he was evaluated in the emergency department for left foot pain.  This initially occurred when he was in the [facility] in [place] approximately 1 month ago.  He does not note any injury but states that he found weakness in the left foot and had difficulty [place] to the beach.  He does have family history of DVT no history of any vascular disease in the family.  Since that time he has had pain in the foot particularly while walking and working.  He also has pain that wakes him at night across the forefoot on the left.  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  He is a current everyday smoker and has used other illicit drugs in the past.  Past Medical History:  Diagnosis Date   Ascending aorta enlargement ([facility])    10/05/14 CT [facility]): 3.8 cm ascending aorta   Hypothyroidism 02/07/2019   Papillary thyroid carcinoma ([facility]) 06/16/2015   Postoperative hypothyroidism 12/22/2016   Thyroid disease    Thyroid nodule 03/26/2015   Family History  Problem Relation Age of Onset   Diabetes Mother    Deep vein thrombosis Father    Thyroid disease Mother    Thyroid disease Sister    Thyroid disease Maternal Grandmother    Past Surgical History:  Procedure Laterality Date   HERNIA REPAIR Left    inguinal   HERNIA REPAIR Left    inguinal hernia   THYROIDECTOMY N/A 06/17/2015   Procedure: TOTAL THYROIDECTOMY WITH LIMITED LYMPH NODE DISECTION;  Surgeon: Darnell Level, MD;  Location: [facility] OR;  Service: General;  Laterality: N/A;    Short Social History:  Social History   Tobacco Use   Smoking status: Every Day     Current packs/day: 1.00    Types: Cigarettes   Smokeless tobacco: Never   Tobacco comments:    smokes 1/2 pack per day.  Substance Use Topics   Alcohol use: Yes    Comment: occasionaly    No Known Allergies  Current Outpatient Medications  Medication Sig Dispense Refill   levothyroxine (SYNTHROID, LEVOTHROID) 125 MCG tablet Take 1 tablet (125 mcg total) by mouth daily. 30 tablet 3   EUTHYROX 100 MCG tablet Take 100 mcg by mouth daily. (Patient not taking: Reported on 01/10/2023)     No current facility-administered medications for this visit.    Review of Systems  Constitutional:  Constitutional negative. HENT: HENT negative.  Eyes: Eyes negative.  Respiratory: Respiratory negative.  Cardiovascular: Cardiovascular negative.  GI: Gastrointestinal negative.  Musculoskeletal: Positive for gait problem and leg pain.  Skin: Skin negative.  Hematologic: Hematologic/lymphatic negative.  Psychiatric: Psychiatric negative.        Objective:  Objective  Vitals:   01/10/23 0817  BP: 135/86  Pulse: 61  Resp: 16  Temp: 97.7 F (36.5 C)  SpO2: 99%     Physical Exam HENT:     Head: Normocephalic.     Nose: Nose normal.     Mouth/Throat:     Mouth: Mucous membranes are moist.  Eyes:  Pupils: Pupils are equal, round, and reactive to light.  Cardiovascular:     Rate and Rhythm: Normal rate.     Pulses:          Femoral pulses are 2+ on the right side and 2+ on the left side.      Popliteal pulses are 0 on the right side and 2+ on the left side.       Dorsalis pedis pulses are 0 on the right side and 0 on the left side.       Posterior tibial pulses are 0 on the right side and 0 on the left side.  Pulmonary:     Effort: Pulmonary effort is normal.  Abdominal:     General: Abdomen is flat.  Musculoskeletal:        General: Normal range of motion.     Cervical back: Normal range of motion and neck supple.     Right lower leg: No edema.     Left lower leg: No edema.   Skin:    General: Skin is warm.     Capillary Refill: Capillary refill takes less than 2 seconds.  Neurological:     General: No focal deficit present.     Mental Status: He is alert.  Psychiatric:        Mood and Affect: Mood normal.        Thought Content: Thought content normal.        Judgment: Judgment normal.     Data: ABI Findings:  +---------+------------------+-----+----------+--------+  Right   Rt Pressure (mmHg)IndexWaveform  Comment   +---------+------------------+-----+----------+--------+  Brachial 143                                        +---------+------------------+-----+----------+--------+  PTA     92                0.64 monophasic          +---------+------------------+-----+----------+--------+  DP      90                0.63 monophasic          +---------+------------------+-----+----------+--------+  Great Toe63                0.44                     +---------+------------------+-----+----------+--------+   +---------+------------------+-----+----------+-------+  Left    Lt Pressure (mmHg)IndexWaveform  Comment  +---------+------------------+-----+----------+-------+  Brachial 132                                       +---------+------------------+-----+----------+-------+  PTA     48                0.34 monophasic         +---------+------------------+-----+----------+-------+  DP      0                 0.00 absent             +---------+------------------+-----+----------+-------+  Great Toe33                0.23                    +---------+------------------+-----+----------+-------+   +-------+-----------+-----------+------------+------------+  ABI/TBIToday's  ABIToday's TBIPrevious ABIPrevious TBI  +-------+-----------+-----------+------------+------------+  Right 0.64       0.44                                  +-------+-----------+-----------+------------+------------+  Left  0.34       0.23                                 +-------+-----------+-----------+------------+------------+        No previous ABI.    Summary:  Right: Resting right ankle-brachial index indicates moderate right lower  extremity arterial disease. The right toe-brachial index is abnormal.   Left: Resting left ankle-brachial index indicates severe left lower  extremity arterial disease. The left toe-brachial index is abnormal.   LEFT       PSV cm/sRatioStenosisWaveform  Comments             +-----------+--------+-----+--------+----------+-------------------+  CFA Distal 79                   triphasic                      +-----------+--------+-----+--------+----------+-------------------+  DFA       17                   triphasic                      +-----------+--------+-----+--------+----------+-------------------+  SFA Prox   95                   triphasic                      +-----------+--------+-----+--------+----------+-------------------+  SFA Mid    81                   triphasic                      +-----------+--------+-----+--------+----------+-------------------+  SFA Distal 56                   triphasic                      +-----------+--------+-----+--------+----------+-------------------+  POP Prox   43                   triphasic                      +-----------+--------+-----+--------+----------+-------------------+  POP Distal 51                   triphasic                      +-----------+--------+-----+--------+----------+-------------------+  TP Trunk   60                   triphasic                      +-----------+--------+-----+--------+----------+-------------------+  ATA Prox   31                   triphasic                      +-----------+--------+-----+--------+----------+-------------------+  ATA  Distal  occluded                               +-----------+--------+-----+--------+----------+-------------------+  PTA Prox   54                   triphasic                      +-----------+--------+-----+--------+----------+-------------------+  PTA Mid    19           occluded          pre occlusive thump  +-----------+--------+-----+--------+----------+-------------------+  PTA Distal 47                   biphasic                       +-----------+--------+-----+--------+----------+-------------------+  PERO Prox  29                   triphasic                      +-----------+--------+-----+--------+----------+-------------------+  PERO Distal12                   monophasic                     +-----------+--------+-----+--------+----------+-------------------+     Summary:  Left: Tibial disease with dampened flow in the distal peroneal artery and  occlusion in the mid ATA to the ankle.      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  His pain is consistent with rest pain from atherosclerotic disease.  From physical exam appears to have occluded SFA on the right without a popliteal pulse but again he is asymptomatic on the left side he has a palpable common femoral and popliteal pulse but appears to have tibial disease in all 3 of his tibial vessels.  Unfortunately I discussed with him that this represents a limb threatening situation although he does not have any wounds he does have rest pain.  As such we will plan for angiography from the right common femoral approach to evaluate the left and hopeful for endovascular intervention possibly will require bypass.  Procedural details discussed with the patient he demonstrates good understanding.  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 The patient is on best medical therapy for peripheral arterial disease. The patient has been counseled about the risks of tobacco use in atherosclerotic disease. The patient has been counseled to abstain from any tobacco use. An aortogram with bilateral lower extremity runoff angiography and Left lower extremity intervention and is indicated to better evaluate the patient's lower extremity circulation because of the limb threatening nature of the patient's diagnosis. Based on the patient's clinical exam and non-invasive data, we anticipate an endovascular intervention in the tibial vessels. Stenting and/or athrectomy would be favored because of the improved primary patency of these interventions as compared to plain balloon angioplasty.    Matvey Llanas C. Randie Heinz, MD [facility] Office: [phone] Pager: [phone]

## 2023-01-10 NOTE — Progress Notes (Signed)
Patient ID: Marc Becker, male   DOB: 1965/10/24, 57 y.o.   MRN: [id number]  Reason for Consult: New Patient (Initial Visit)   Referred by Marcine Matar, MD  Subjective:     HPI:  Marc Becker is a 57 y.o. male without significant history of vascular disease.  He was many years ago to have an increased size of his ascending aorta.  More recently he was evaluated in the emergency department for left foot pain.  This initially occurred when he was in the [facility] in [place] approximately 1 month ago.  He does not note any injury but states that he found weakness in the left foot and had difficulty [place] to the beach.  He does have family history of DVT no history of any vascular disease in the family.  Since that time he has had pain in the foot particularly while walking and working.  He also has pain that wakes him at night across the forefoot on the left.  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  He is a current everyday smoker and has used other illicit drugs in the past.  Past Medical History:  Diagnosis Date   Ascending aorta enlargement (HCC)    10/05/14 CT [facility]): 3.8 cm ascending aorta   Hypothyroidism 02/07/2019   Papillary thyroid carcinoma (HCC) 06/16/2015   Postoperative hypothyroidism 12/22/2016   Thyroid disease    Thyroid nodule 03/26/2015   Family History  Problem Relation Age of Onset   Diabetes Mother    Deep vein thrombosis Father    Thyroid disease Mother    Thyroid disease Sister    Thyroid disease Maternal Grandmother    Past Surgical History:  Procedure Laterality Date   HERNIA REPAIR Left    inguinal   HERNIA REPAIR Left    inguinal hernia   THYROIDECTOMY N/A 06/17/2015   Procedure: TOTAL THYROIDECTOMY WITH LIMITED LYMPH NODE DISECTION;  Surgeon: Darnell Level, MD;  Location: [facility] OR;  Service: General;  Laterality: N/A;    Short Social History:  Social History   Tobacco Use   Smoking status: Every Day     Current packs/day: 1.00    Types: Cigarettes   Smokeless tobacco: Never   Tobacco comments:    smokes 1/2 pack per day.  Substance Use Topics   Alcohol use: Yes    Comment: occasionaly    No Known Allergies  Current Outpatient Medications  Medication Sig Dispense Refill   levothyroxine (SYNTHROID, LEVOTHROID) 125 MCG tablet Take 1 tablet (125 mcg total) by mouth daily. 30 tablet 3   EUTHYROX 100 MCG tablet Take 100 mcg by mouth daily. (Patient not taking: Reported on 01/10/2023)     No current facility-administered medications for this visit.    Review of Systems  Constitutional:  Constitutional negative. HENT: HENT negative.  Eyes: Eyes negative.  Respiratory: Respiratory negative.  Cardiovascular: Cardiovascular negative.  GI: Gastrointestinal negative.  Musculoskeletal: Positive for gait problem and leg pain.  Skin: Skin negative.  Hematologic: Hematologic/lymphatic negative.  Psychiatric: Psychiatric negative.        Objective:  Objective  Vitals:   01/10/23 0817  BP: 135/86  Pulse: 61  Resp: 16  Temp: 97.7 F (36.5 C)  SpO2: 99%     Physical Exam HENT:     Head: Normocephalic.     Nose: Nose normal.     Mouth/Throat:     Mouth: Mucous membranes are moist.  Eyes:  Pupils: Pupils are equal, round, and reactive to light.  Cardiovascular:     Rate and Rhythm: Normal rate.     Pulses:          Femoral pulses are 2+ on the right side and 2+ on the left side.      Popliteal pulses are 0 on the right side and 2+ on the left side.       Dorsalis pedis pulses are 0 on the right side and 0 on the left side.       Posterior tibial pulses are 0 on the right side and 0 on the left side.  Pulmonary:     Effort: Pulmonary effort is normal.  Abdominal:     General: Abdomen is flat.  Musculoskeletal:        General: Normal range of motion.     Cervical back: Normal range of motion and neck supple.     Right lower leg: No edema.     Left lower leg: No edema.   Skin:    General: Skin is warm.     Capillary Refill: Capillary refill takes less than 2 seconds.  Neurological:     General: No focal deficit present.     Mental Status: He is alert.  Psychiatric:        Mood and Affect: Mood normal.        Thought Content: Thought content normal.        Judgment: Judgment normal.     Data: ABI Findings:  +---------+------------------+-----+----------+--------+  Right   Rt Pressure (mmHg)IndexWaveform  Comment   +---------+------------------+-----+----------+--------+  Brachial 143                                        +---------+------------------+-----+----------+--------+  PTA     92                0.64 monophasic          +---------+------------------+-----+----------+--------+  DP      90                0.63 monophasic          +---------+------------------+-----+----------+--------+  Great Toe63                0.44                     +---------+------------------+-----+----------+--------+   +---------+------------------+-----+----------+-------+  Left    Lt Pressure (mmHg)IndexWaveform  Comment  +---------+------------------+-----+----------+-------+  Brachial 132                                       +---------+------------------+-----+----------+-------+  PTA     48                0.34 monophasic         +---------+------------------+-----+----------+-------+  DP      0                 0.00 absent             +---------+------------------+-----+----------+-------+  Great Toe33                0.23                    +---------+------------------+-----+----------+-------+   +-------+-----------+-----------+------------+------------+  ABI/TBIToday's  ABIToday's TBIPrevious ABIPrevious TBI  +-------+-----------+-----------+------------+------------+  Right 0.64       0.44                                  +-------+-----------+-----------+------------+------------+  Left  0.34       0.23                                 +-------+-----------+-----------+------------+------------+        No previous ABI.    Summary:  Right: Resting right ankle-brachial index indicates moderate right lower  extremity arterial disease. The right toe-brachial index is abnormal.   Left: Resting left ankle-brachial index indicates severe left lower  extremity arterial disease. The left toe-brachial index is abnormal.   LEFT       PSV cm/sRatioStenosisWaveform  Comments             +-----------+--------+-----+--------+----------+-------------------+  CFA Distal 79                   triphasic                      +-----------+--------+-----+--------+----------+-------------------+  DFA       17                   triphasic                      +-----------+--------+-----+--------+----------+-------------------+  SFA Prox   95                   triphasic                      +-----------+--------+-----+--------+----------+-------------------+  SFA Mid    81                   triphasic                      +-----------+--------+-----+--------+----------+-------------------+  SFA Distal 56                   triphasic                      +-----------+--------+-----+--------+----------+-------------------+  POP Prox   43                   triphasic                      +-----------+--------+-----+--------+----------+-------------------+  POP Distal 51                   triphasic                      +-----------+--------+-----+--------+----------+-------------------+  TP Trunk   60                   triphasic                      +-----------+--------+-----+--------+----------+-------------------+  ATA Prox   31                   triphasic                      +-----------+--------+-----+--------+----------+-------------------+  ATA  Distal  occluded                               +-----------+--------+-----+--------+----------+-------------------+  PTA Prox   54                   triphasic                      +-----------+--------+-----+--------+----------+-------------------+  PTA Mid    19           occluded          pre occlusive thump  +-----------+--------+-----+--------+----------+-------------------+  PTA Distal 47                   biphasic                       +-----------+--------+-----+--------+----------+-------------------+  PERO Prox  29                   triphasic                      +-----------+--------+-----+--------+----------+-------------------+  PERO Distal12                   monophasic                     +-----------+--------+-----+--------+----------+-------------------+     Summary:  Left: Tibial disease with dampened flow in the distal peroneal artery and  occlusion in the mid ATA to the ankle.      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  His pain is consistent with rest pain from atherosclerotic disease.  From physical exam appears to have occluded SFA on the right without a popliteal pulse but again he is asymptomatic on the left side he has a palpable common femoral and popliteal pulse but appears to have tibial disease in all 3 of his tibial vessels.  Unfortunately I discussed with him that this represents a limb threatening situation although he does not have any wounds he does have rest pain.  As such we will plan for angiography from the right common femoral approach to evaluate the left and hopeful for endovascular intervention possibly will require bypass.  Procedural details discussed with the patient he demonstrates good understanding.  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 The patient is on best medical therapy for peripheral arterial disease. The patient has been counseled about the risks of tobacco use in atherosclerotic disease. The patient has been counseled to abstain from any tobacco use. An aortogram with bilateral lower extremity runoff angiography and Left lower extremity intervention and is indicated to better evaluate the patient's lower extremity circulation because of the limb threatening nature of the patient's diagnosis. Based on the patient's clinical exam and non-invasive data, we anticipate an endovascular intervention in the tibial vessels. Stenting and/or athrectomy would be favored because of the improved primary patency of these interventions as compared to plain balloon angioplasty.    Matvey Llanas C. Randie Heinz, MD [facility] Office: [phone] Pager: [phone]

## 2023-01-11 ENCOUNTER — Telehealth: Payer: Self-pay
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
## 2023-01-17 ENCOUNTER — Telehealth: Payer: Self-pay

## 2023-01-17 ENCOUNTER — Encounter: Payer: Self-pay | Admitting: Vascular Surgery

## 2023-01-17 NOTE — Telephone Encounter (Signed)
Attempted to reach patient to schedule aortogram. No answer and VM full.

## 2023-01-19 ENCOUNTER — Other Ambulatory Visit: Payer: Self-pay

## 2023-01-19 DIAGNOSIS — I70222 Atherosclerosis of native arteries of extremities with rest pain, left leg: Secondary | ICD-10-CM

## 2023-01-19 NOTE — Telephone Encounter (Signed)
Patient returned call. Scheduled aortogram for 9/16. Instructions provided and patient voiced understanding.

## 2023-01-26 NOTE — Telephone Encounter (Signed)
Patient rescheduled for 9/23, pending insurance authorization. Patient verbalized understanding.

## 2023-02-05 ENCOUNTER — Other Ambulatory Visit: Payer: Self-pay

## 2023-02-05 ENCOUNTER — Ambulatory Visit (HOSPITAL_COMMUNITY)
Admission: RE | Admit: 2023-02-05 | Discharge: 2023-02-05 | Disposition: A | Discharge status: Home / Self Care | Payer: Managed Care, Other (non HMO) | Source: Ambulatory Visit | Attending: Vascular Surgery | Admitting: Vascular Surgery

## 2023-02-05 ENCOUNTER — Encounter (HOSPITAL_COMMUNITY)
Admission: RE | Disposition: A | Discharge status: Home / Self Care | Payer: Self-pay | Source: Ambulatory Visit | Attending: Vascular Surgery

## 2023-02-05 DIAGNOSIS — F17210 Nicotine dependence, cigarettes, uncomplicated: Secondary | ICD-10-CM | POA: Insufficient documentation

## 2023-02-05 DIAGNOSIS — Z85850 Personal history of malignant neoplasm of thyroid: Secondary | ICD-10-CM | POA: Insufficient documentation

## 2023-02-05 DIAGNOSIS — I70222 Atherosclerosis of native arteries of extremities with rest pain, left leg: Secondary | ICD-10-CM | POA: Diagnosis present

## 2023-02-05 DIAGNOSIS — Z8349 Family history of other endocrine, nutritional and metabolic diseases: Secondary | ICD-10-CM | POA: Diagnosis not present

## 2023-02-05 DIAGNOSIS — E890 Postprocedural hypothyroidism: Secondary | ICD-10-CM | POA: Diagnosis not present

## 2023-02-05 HISTORY — PX: ABDOMINAL AORTOGRAM W/LOWER EXTREMITY: CATH118223

## 2023-02-05 LAB — POCT I-STAT, CHEM 8
BUN: 21 mg/dL — ABNORMAL HIGH (ref 6–20)
Calcium, Ion: 1.12 mmol/L — ABNORMAL LOW (ref 1.15–1.40)
Chloride: 105 mmol/L (ref 98–111)
Creatinine, Ser: 0.9 mg/dL (ref 0.61–1.24)
Glucose, Bld: 84 mg/dL (ref 70–99)
HCT: 41 % (ref 39.0–52.0)
Hemoglobin: 13.9 g/dL (ref 13.0–17.0)
Potassium: 5.5 mmol/L — ABNORMAL HIGH (ref 3.5–5.1)
Sodium: 138 mmol/L (ref 135–145)
TCO2: 25 mmol/L (ref 22–32)

## 2023-02-05 SURGERY — ABDOMINAL AORTOGRAM W/LOWER EXTREMITY
Anesthesia: LOCAL

## 2023-02-05 MED ORDER — SODIUM CHLORIDE 0.9 % WEIGHT BASED INFUSION: 1.0000 mL/kg/h | INTRAVENOUS | Status: DC

## 2023-02-05 MED ORDER — SODIUM CHLORIDE 0.9 % IV SOLN: INTRAVENOUS | Status: DC

## 2023-02-05 MED ORDER — LABETALOL HCL 5 MG/ML IV SOLN: 10.0000 mg | INTRAVENOUS | Status: DC | PRN

## 2023-02-05 MED ORDER — IODIXANOL 320 MG/ML IV SOLN
INTRAVENOUS | Status: DC | PRN
  Administered 2023-02-05: 76 mL

## 2023-02-05 MED ORDER — ACETAMINOPHEN 325 MG PO TABS: 650.0000 mg | ORAL_TABLET | ORAL | Status: DC | PRN

## 2023-02-05 MED ORDER — MIDAZOLAM HCL 2 MG/2ML IJ SOLN
INTRAMUSCULAR | Status: AC
  Filled 2023-02-05: qty 2

## 2023-02-05 MED ORDER — ROSUVASTATIN CALCIUM 10 MG PO TABS: 10.0000 mg | ORAL_TABLET | Freq: Every day | ORAL | Status: DC

## 2023-02-05 MED ORDER — HEPARIN (PORCINE) IN NACL 1000-0.9 UT/500ML-% IV SOLN
INTRAVENOUS | Status: DC | PRN
  Administered 2023-02-05 (×2): 500 mL

## 2023-02-05 MED ORDER — SODIUM CHLORIDE 0.9% FLUSH: 3.0000 mL | INTRAVENOUS | Status: DC | PRN

## 2023-02-05 MED ORDER — SODIUM CHLORIDE 0.9 % IV SOLN: 250.0000 mL | INTRAVENOUS | Status: DC | PRN

## 2023-02-05 MED ORDER — HYDRALAZINE HCL 20 MG/ML IJ SOLN: 5.0000 mg | INTRAMUSCULAR | Status: DC | PRN

## 2023-02-05 MED ORDER — ROSUVASTATIN CALCIUM 10 MG PO TABS: 10.0000 mg | ORAL_TABLET | Freq: Every day | ORAL | 11 refills | Status: DC

## 2023-02-05 MED ORDER — LIDOCAINE HCL (PF) 1 % IJ SOLN
INTRAMUSCULAR | Status: DC | PRN
  Administered 2023-02-05: 12 mL

## 2023-02-05 MED ORDER — LIDOCAINE HCL (PF) 1 % IJ SOLN
INTRAMUSCULAR | Status: AC
  Filled 2023-02-05: qty 30

## 2023-02-05 MED ORDER — FENTANYL CITRATE (PF) 100 MCG/2ML IJ SOLN
INTRAMUSCULAR | Status: DC | PRN
  Administered 2023-02-05: 50 ug via INTRAVENOUS

## 2023-02-05 MED ORDER — MIDAZOLAM HCL 2 MG/2ML IJ SOLN
INTRAMUSCULAR | Status: DC | PRN
  Administered 2023-02-05: 1 mg via INTRAVENOUS

## 2023-02-05 MED ORDER — FENTANYL CITRATE (PF) 100 MCG/2ML IJ SOLN
INTRAMUSCULAR | Status: AC
  Filled 2023-02-05: qty 2

## 2023-02-05 MED ORDER — SODIUM CHLORIDE 0.9% FLUSH: 3.0000 mL | Freq: Two times a day (BID) | INTRAVENOUS | Status: DC

## 2023-02-05 MED ORDER — ONDANSETRON HCL 4 MG/2ML IJ SOLN: 4.0000 mg | Freq: Four times a day (QID) | INTRAMUSCULAR | Status: DC | PRN

## 2023-02-05 SURGICAL SUPPLY — 11 items
CATH OMNI FLUSH 5F 65CM (CATHETERS) IMPLANT
CLOSURE MYNX CONTROL 5F (Vascular Products) IMPLANT
COVER DOME SNAP 22 D (MISCELLANEOUS) IMPLANT
KIT MICROPUNCTURE NIT STIFF (SHEATH) IMPLANT
KIT PV (KITS) ×2 IMPLANT
SET ATX-X65L (MISCELLANEOUS) IMPLANT
SHEATH PINNACLE 5F 10CM (SHEATH) IMPLANT
SHEATH PROBE COVER 6X72 (BAG) IMPLANT
TRANSDUCER W/STOPCOCK (MISCELLANEOUS) ×2 IMPLANT
TRAY PV CATH (CUSTOM PROCEDURE TRAY) ×2 IMPLANT
WIRE BENTSON .035X145CM (WIRE) IMPLANT

## 2023-02-05 NOTE — Interval H&P Note (Signed)
History and Physical Interval Note:  02/05/2023 11:03 AM  Marc Becker  has presented today for surgery, with the diagnosis of Atherosclerosis of native artery of left lower extremity with rest pain.  The various methods of treatment have been discussed with the patient and family. After consideration of risks, benefits and other options for treatment, the patient has consented to  Procedure(s): ABDOMINAL AORTOGRAM W/LOWER EXTREMITY (N/A) as a surgical intervention.  The patient's history has been reviewed, patient examined, no change in status, stable for surgery.  I have reviewed the patient's chart and labs.  Questions were answered to the patient's satisfaction.     Lemar Livings

## 2023-02-05 NOTE — Op Note (Signed)
Patient name: Marc Becker MRN: [id number] DOB: 1966/02/28 Sex: male  02/05/2023 Pre-operative Diagnosis: Atherosclerosis native arteries with left lower extremity rest pain Post-operative diagnosis:  Same Surgeon:  Luanna Salk. Randie Heinz, MD Procedure Performed: 1.  Percutaneous ultrasound-guided access and Mynx device closure right common femoral artery 2.  Aortogram with bilateral lower extremity angiography 3.  Selection with catheter of left common femoral artery 4.  Moderate sedation with fentanyl and Versed for 27 minutes  Indications: 57 year old male with severely depressed ABI on the left with rest pain with no tissue loss.  He has concomitant claudication.  He is indicated for angiography with possible intervention.  Findings: Aorta and iliac segments are free of flow-limiting stenosis.  On the left side the left common femoral, SFA, popliteal arteries are all patent.  The tibioperoneal trunk is patent and he has runoff via the posterior tibial to just above the ankle.  He may be reconstitutes a plantar artery but mostly he does not have any flow into the foot and the anterior tibial artery appears occluded proximally with the peroneal artery running to the ankle.  From a left lower extremity standpoint patient needs complete cessation of all smoking and will add Crestor to aspirin and he needs foot protection and continued walking.  On the right lower extremity the common femoral artery and proximal SFA are patent the SFA then occludes and reconstitutes at the tibial trifurcation with dominant runoff via the posterior tibial artery.  From a right side standpoint given that he is mostly asymptomatic he needs again complete cessation of smoking and aspirin and a statin.  I will have him follow-up in approximately 6 months with repeat ABIs unless he has issues before that.   Procedure:  The patient was identified in the holding area and taken to room 8.  The patient was then placed supine on  the table and prepped and draped in the usual sterile fashion.  A time out was called.  Ultrasound was used to evaluate the right common femoral artery which was noted to be patent.  The area was anesthetized 1% lidocaine and cannulated with a micropuncture needle followed by wire and a sheath.  Concomitantly we administered fentanyl and Versed as moderate sedation and his vital signs were monitored by bedside nursing throughout the case.  We placed a Bentson wire followed by a 5 French sheath and Omni catheter to the level of L1 and performed aortogram and then crossed the bifurcation selective the left common femoral artery and perform left lower extremity angiography.  Contrast was severely delayed likely due to underlying cardiac insufficiency and he had flow all the way to the level of the ankle via the posterior tibial artery although did not appear to have any significant flow into the foot.  With this we removed the catheter over wire perform right lower extremity angiography.  Mynx device was then deployed.  He tolerated the procedure without any complication.   Contrast: 76 cc  Rigo Letts C. Randie Heinz, MD [facility] Office: [phone] Pager: [phone]

## 2023-02-05 NOTE — Progress Notes (Signed)
Up and walked and tolerated well; right groin stable, no bleeding or hematoma

## 2023-02-05 NOTE — Progress Notes (Signed)
Patient was given discharge instructions. He verbalized understanding.

## 2023-02-06 ENCOUNTER — Encounter: Payer: Self-pay | Admitting: Vascular Surgery

## 2023-02-06 ENCOUNTER — Encounter (HOSPITAL_COMMUNITY): Payer: Self-pay | Admitting: Vascular Surgery

## 2023-07-30 ENCOUNTER — Other Ambulatory Visit: Payer: Self-pay

## 2023-07-30 DIAGNOSIS — I70222 Atherosclerosis of native arteries of extremities with rest pain, left leg: Secondary | ICD-10-CM

## 2023-08-08 ENCOUNTER — Ambulatory Visit (HOSPITAL_COMMUNITY): Payer: Managed Care, Other (non HMO) | Attending: Vascular Surgery

## 2023-08-08 ENCOUNTER — Ambulatory Visit: Payer: Managed Care, Other (non HMO)

## 2024-03-20 LAB — COLOGUARD: COLOGUARD: POSITIVE — AB

## 2024-04-28 ENCOUNTER — Other Ambulatory Visit: Payer: Self-pay | Admitting: Vascular Surgery

## 2024-05-21 ENCOUNTER — Other Ambulatory Visit: Payer: Self-pay | Admitting: Vascular Surgery

## 2024-06-11 ENCOUNTER — Ambulatory Visit (HOSPITAL_BASED_OUTPATIENT_CLINIC_OR_DEPARTMENT_OTHER): Admit: 2024-06-11 | Discharge: 2024-06-11 | Disposition: A | Admitting: Radiology

## 2024-06-11 ENCOUNTER — Encounter (HOSPITAL_BASED_OUTPATIENT_CLINIC_OR_DEPARTMENT_OTHER): Payer: Self-pay | Admitting: Emergency Medicine

## 2024-06-11 ENCOUNTER — Ambulatory Visit (HOSPITAL_BASED_OUTPATIENT_CLINIC_OR_DEPARTMENT_OTHER)
Admission: EM | Admit: 2024-06-11 | Discharge: 2024-06-11 | Disposition: A | Discharge status: Home / Self Care | Attending: Family Medicine | Admitting: Family Medicine

## 2024-06-11 ENCOUNTER — Other Ambulatory Visit (HOSPITAL_BASED_OUTPATIENT_CLINIC_OR_DEPARTMENT_OTHER): Payer: Self-pay

## 2024-06-11 DIAGNOSIS — S3210XA Unspecified fracture of sacrum, initial encounter for closed fracture: Secondary | ICD-10-CM | POA: Diagnosis not present

## 2024-06-11 DIAGNOSIS — R0789 Other chest pain: Secondary | ICD-10-CM | POA: Diagnosis not present

## 2024-06-11 DIAGNOSIS — M533 Sacrococcygeal disorders, not elsewhere classified: Secondary | ICD-10-CM

## 2024-06-11 DIAGNOSIS — M546 Pain in thoracic spine: Secondary | ICD-10-CM | POA: Diagnosis not present

## 2024-06-11 DIAGNOSIS — W010XXA Fall on same level from slipping, tripping and stumbling without subsequent striking against object, initial encounter: Secondary | ICD-10-CM

## 2024-06-11 DIAGNOSIS — S2231XA Fracture of one rib, right side, initial encounter for closed fracture: Secondary | ICD-10-CM

## 2024-06-11 MED ORDER — DICLOFENAC SODIUM 50 MG PO TBEC
50.0000 mg | DELAYED_RELEASE_TABLET | Freq: Two times a day (BID) | ORAL | 0 refills | Status: AC | PRN
  Filled 2024-06-11: qty 30, 15d supply, fill #0

## 2024-06-11 MED ORDER — CYCLOBENZAPRINE HCL 5 MG PO TABS
5.0000 mg | ORAL_TABLET | Freq: Every evening | ORAL | 0 refills | Status: AC | PRN
  Filled 2024-06-11: qty 15, 15d supply, fill #0

## 2024-06-11 NOTE — ED Triage Notes (Signed)
 Pt was feeding his chickens yesterday and he slipped on ice and he landed on his back he is sore and hurt in his middle back.
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
